# Patient Record
Sex: Male | Born: 1935 | Race: White | Hispanic: No | Smoking: Former smoker
Health system: Southern US, Community
[De-identification: ages and names within clinical notes are randomized; demographics above are authoritative.]

## PROBLEM LIST (undated history)

## (undated) DIAGNOSIS — C689 Malignant neoplasm of urinary organ, unspecified: Secondary | ICD-10-CM

## (undated) DIAGNOSIS — I10 Essential (primary) hypertension: Secondary | ICD-10-CM

## (undated) DIAGNOSIS — N289 Disorder of kidney and ureter, unspecified: Secondary | ICD-10-CM

## (undated) DIAGNOSIS — I4891 Unspecified atrial fibrillation: Secondary | ICD-10-CM

## (undated) DIAGNOSIS — B0229 Other postherpetic nervous system involvement: Secondary | ICD-10-CM

## (undated) HISTORY — DX: Other postherpetic nervous system involvement: B02.29

## (undated) HISTORY — DX: Disorder of kidney and ureter, unspecified: N28.9

## (undated) HISTORY — PX: APPENDECTOMY: SHX54

## (undated) HISTORY — DX: Unspecified atrial fibrillation: I48.91

## (undated) HISTORY — DX: Essential (primary) hypertension: I10

## (undated) HISTORY — DX: Malignant neoplasm of urinary organ, unspecified: C68.9

---

## 2000-04-11 ENCOUNTER — Encounter: Payer: Self-pay | Admitting: Internal Medicine

## 2000-10-31 DIAGNOSIS — B0229 Other postherpetic nervous system involvement: Secondary | ICD-10-CM

## 2003-12-17 ENCOUNTER — Encounter: Payer: Self-pay | Admitting: Internal Medicine

## 2004-06-12 ENCOUNTER — Ambulatory Visit: Payer: Self-pay | Admitting: Internal Medicine

## 2004-06-18 ENCOUNTER — Ambulatory Visit: Payer: Self-pay | Admitting: Internal Medicine

## 2004-11-30 DIAGNOSIS — D510 Vitamin B12 deficiency anemia due to intrinsic factor deficiency: Secondary | ICD-10-CM

## 2004-12-17 ENCOUNTER — Ambulatory Visit: Payer: Self-pay | Admitting: Internal Medicine

## 2004-12-21 ENCOUNTER — Ambulatory Visit: Payer: Self-pay | Admitting: Internal Medicine

## 2005-01-22 ENCOUNTER — Ambulatory Visit: Payer: Self-pay | Admitting: Internal Medicine

## 2005-02-22 ENCOUNTER — Ambulatory Visit: Payer: Self-pay | Admitting: Internal Medicine

## 2005-03-15 ENCOUNTER — Ambulatory Visit: Payer: Self-pay | Admitting: Unknown Physician Specialty

## 2005-03-22 ENCOUNTER — Ambulatory Visit: Payer: Self-pay | Admitting: Internal Medicine

## 2005-03-29 ENCOUNTER — Ambulatory Visit: Payer: Self-pay | Admitting: Internal Medicine

## 2005-04-29 ENCOUNTER — Ambulatory Visit: Payer: Self-pay | Admitting: Internal Medicine

## 2005-05-12 ENCOUNTER — Ambulatory Visit: Payer: Self-pay | Admitting: Internal Medicine

## 2005-05-27 ENCOUNTER — Ambulatory Visit: Payer: Self-pay | Admitting: Internal Medicine

## 2005-06-21 ENCOUNTER — Ambulatory Visit: Payer: Self-pay | Admitting: Internal Medicine

## 2006-04-27 ENCOUNTER — Ambulatory Visit: Payer: Self-pay | Admitting: Internal Medicine

## 2009-02-26 ENCOUNTER — Telehealth: Payer: Self-pay | Admitting: Internal Medicine

## 2009-03-31 ENCOUNTER — Ambulatory Visit: Payer: Self-pay | Admitting: Internal Medicine

## 2009-03-31 DIAGNOSIS — N259 Disorder resulting from impaired renal tubular function, unspecified: Secondary | ICD-10-CM | POA: Insufficient documentation

## 2009-03-31 DIAGNOSIS — I10 Essential (primary) hypertension: Secondary | ICD-10-CM | POA: Insufficient documentation

## 2009-04-02 LAB — CONVERTED CEMR LAB
AST: 24 units/L (ref 0–37)
Alkaline Phosphatase: 115 units/L (ref 39–117)
Basophils Absolute: 0 10*3/uL (ref 0.0–0.1)
Basophils Relative: 0 % (ref 0.0–3.0)
Glucose, Bld: 95 mg/dL (ref 70–99)
Hemoglobin: 15.7 g/dL (ref 13.0–17.0)
Lymphocytes Relative: 16.1 % (ref 12.0–46.0)
Monocytes Relative: 3.9 % (ref 3.0–12.0)
Neutro Abs: 5 10*3/uL (ref 1.4–7.7)
Phosphorus: 3.7 mg/dL (ref 2.3–4.6)
Potassium: 5.3 meq/L — ABNORMAL HIGH (ref 3.5–5.1)
RBC: 4.78 M/uL (ref 4.22–5.81)
Sodium: 141 meq/L (ref 135–145)
TSH: 3.61 microintl units/mL (ref 0.35–5.50)
WBC: 6.5 10*3/uL (ref 4.5–10.5)

## 2009-04-15 ENCOUNTER — Ambulatory Visit: Payer: Self-pay | Admitting: Internal Medicine

## 2009-04-15 DIAGNOSIS — R634 Abnormal weight loss: Secondary | ICD-10-CM

## 2009-04-15 LAB — CONVERTED CEMR LAB
Bilirubin Urine: NEGATIVE
Urobilinogen, UA: 0.2

## 2009-04-16 ENCOUNTER — Encounter: Payer: Self-pay | Admitting: Internal Medicine

## 2009-04-16 LAB — CONVERTED CEMR LAB
BUN: 25 mg/dL — ABNORMAL HIGH (ref 6–23)
CO2: 30 meq/L (ref 19–32)
Chloride: 102 meq/L (ref 96–112)
Potassium: 4.5 meq/L (ref 3.5–5.1)
Sodium: 139 meq/L (ref 135–145)

## 2009-04-21 ENCOUNTER — Ambulatory Visit: Payer: Self-pay | Admitting: Internal Medicine

## 2009-04-21 LAB — CONVERTED CEMR LAB: Potassium: 4.9 meq/L (ref 3.5–5.1)

## 2009-05-16 ENCOUNTER — Ambulatory Visit: Payer: Self-pay | Admitting: Internal Medicine

## 2010-06-01 ENCOUNTER — Ambulatory Visit: Payer: Self-pay | Admitting: Internal Medicine

## 2010-06-01 ENCOUNTER — Encounter: Payer: Self-pay | Admitting: Internal Medicine

## 2010-06-01 DIAGNOSIS — I4819 Other persistent atrial fibrillation: Secondary | ICD-10-CM

## 2010-06-01 DIAGNOSIS — R5381 Other malaise: Secondary | ICD-10-CM

## 2010-06-01 DIAGNOSIS — R5383 Other fatigue: Secondary | ICD-10-CM

## 2010-06-02 LAB — CONVERTED CEMR LAB
ALT: 18 units/L (ref 0–53)
AST: 24 units/L (ref 0–37)
Alkaline Phosphatase: 92 units/L (ref 39–117)
CO2: 26 meq/L (ref 19–32)
Chloride: 89 meq/L — ABNORMAL LOW (ref 96–112)
Eosinophils Relative: 0.3 % (ref 0.0–5.0)
GFR calc non Af Amer: 26.93 mL/min (ref >60)
HCT: 45 % (ref 39.0–52.0)
Hemoglobin: 15.6 g/dL (ref 13.0–17.0)
Lymphocytes Relative: 3.6 % — ABNORMAL LOW (ref 12.0–46.0)
Lymphs Abs: 0.5 10*3/uL — ABNORMAL LOW (ref 0.7–4.0)
Monocytes Relative: 4.6 % (ref 3.0–12.0)
Neutro Abs: 13 10*3/uL — ABNORMAL HIGH (ref 1.4–7.7)
Potassium: 3.1 meq/L — ABNORMAL LOW (ref 3.5–5.1)
RBC: 4.83 M/uL (ref 4.22–5.81)
Sodium: 132 meq/L — ABNORMAL LOW (ref 135–145)
TSH: 4.76 microintl units/mL (ref 0.35–5.50)
WBC: 14.2 10*3/uL — ABNORMAL HIGH (ref 4.5–10.5)

## 2010-06-05 ENCOUNTER — Ambulatory Visit: Payer: Self-pay | Admitting: Internal Medicine

## 2010-06-08 LAB — CONVERTED CEMR LAB
BUN: 31 mg/dL — ABNORMAL HIGH (ref 6–23)
Calcium: 8.6 mg/dL (ref 8.4–10.5)
Creatinine, Ser: 2.1 mg/dL — ABNORMAL HIGH (ref 0.4–1.5)
GFR calc non Af Amer: 31.53 mL/min (ref >60)
Glucose, Bld: 107 mg/dL — ABNORMAL HIGH (ref 70–99)
Sodium: 136 meq/L (ref 135–145)

## 2010-06-23 ENCOUNTER — Ambulatory Visit: Payer: Self-pay | Admitting: Internal Medicine

## 2010-06-24 LAB — CONVERTED CEMR LAB
BUN: 21 mg/dL (ref 6–23)
CO2: 31 meq/L (ref 19–32)
Calcium: 8.7 mg/dL (ref 8.4–10.5)
Creatinine, Ser: 1.9 mg/dL — ABNORMAL HIGH (ref 0.4–1.5)
Glucose, Bld: 74 mg/dL (ref 70–99)
Sodium: 139 meq/L (ref 135–145)

## 2010-09-01 NOTE — Assessment & Plan Note (Signed)
Summary: 4 DAY F/U DLO   Vital Signs:  Patient profile:   77 year old male Weight:      141 pounds O2 Sat:      95 % on Room air Temp:     97.9 degrees F oral Pulse rate:   90 / minute Pulse rhythm:   regular BP sitting:   120 / 60  (left arm) Cuff size:   regular  Vitals Entered By: Mervin Hack CMA Duncan Dull) (June 05, 2010 11:48 AM)  O2 Flow:  Room air CC: 4 day follow-up   History of Present Illness: Feels a little better Stopped the diuretic Has been taking the gatorade as instructed Appetite is still not very good  energy level is somewhat better has just started using boost  No chest pain No SOB No edema  Still with stable right posterior neck post herpetic neuralgia he uses topical Rx  Allergies: No Known Drug Allergies  Review of Systems       sleeping okay No nausea or vomiting  Physical Exam  General:  alert.  Brighter today Mouth:  thrush almost resolved open areas on lips now with dry eschar Neck:  supple, no masses, and no cervical lymphadenopathy.   Lungs:  normal respiratory effort, no intercostal retractions, no accessory muscle use, and normal breath sounds.   Heart:  normal rate, regular rhythm, no murmur, and no gallop.   Abdomen:  soft and non-tender.   Extremities:  no edema Psych:  normally interactive, good eye contact, not anxious appearing, and not depressed appearing.     Impression & Recommendations:  Problem # 1:  RENAL INSUFFICIENCY (ICD-588.9) Assessment Deteriorated  renal function was much worse likely a lot was prerenal he feels better now with diuretic stopped and some gatorade will recheck labs continue boost and gatorade  recheck in several weeks if no better, consider renal ultrasound and other work up if weight not up  Orders: TLB-Renal Function Panel (80069-RENAL) Venipuncture (16109)  Problem # 2:  ATRIAL FIBRILLATION (ICD-427.31) Assessment: Improved seems regular today now breathing  better  His updated medication list for this problem includes:    Aspirin 81 Mg Tabs (Aspirin) .Marland Kitchen... 1 tab daily to help prevent stroke  Complete Medication List: 1)  First-dukes Mouthwash Susp (Diphenhyd-hydrocort-nystatin) .... 5cc swish and mouth and then spit four times daily 2)  Aspirin 81 Mg Tabs (Aspirin) .Marland Kitchen.. 1 tab daily to help prevent stroke  Patient Instructions: 1)  Please continue to take gatorade and boost daily 2)  Please schedule a follow-up appointment in 2-3  weeks.    Orders Added: 1)  Est. Patient Level III [60454] 2)  TLB-Renal Function Panel [80069-RENAL] 3)  Venipuncture [09811]    Current Allergies (reviewed today): No known allergies

## 2010-09-01 NOTE — Assessment & Plan Note (Signed)
Summary: 2-3 WEEK FOLLOW UP/[facility]   Vital Signs:  Patient profile:   77 year old male Weight:      152 pounds Temp:     97.5 degrees F oral Pulse rate:   60 / minute Pulse rhythm:   regular BP sitting:   120 / 60  (left arm) Cuff size:   large  Vitals Entered By: Mervin Hack CMA Duncan Dull) (June 23, 2010 10:48 AM) CC: 2-3 week follow-up   History of Present Illness: Feels much better now still using the boost and gatorade  Strength is much better Still notes a little orthostatic dizziness but much better  No palpitations No chest pain No edema  Appetite is better eating more  Allergies: No Known Drug Allergies  Past History:  Past medical, surgical, family and social histories (including risk factors) reviewed for relevance to current acute and chronic problems.  Past Medical History: Reviewed history from 06/01/2010 and no changes required. Hypertension Renal insufficiency Post herpetic neuralgia Atrial fibrillation  Past Surgical History: Reviewed history from 03/31/2009 and no changes required. Appendectomy-1937 Kidney Stones-1970  Family History: Reviewed history from 03/31/2009 and no changes required. Dad died--unknown causes Mom died in 71's of old age 30 brother, 1 sister No CAD,DM,HTN No prostate or colon cancer  Social History: Reviewed history from 03/31/2009 and no changes required. 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  LOOKs much better Neck:  supple, no masses, no thyromegaly, and no cervical lymphadenopathy.   Lungs:  normal respiratory effort, no intercostal retractions, no accessory muscle use, and normal breath  sounds.   Heart:  normal rate, regular rhythm, no murmur, and no gallop.   Extremities:  no edema Psych:  normally interactive, good eye contact, not anxious appearing, and not depressed appearing.     Impression & Recommendations:  Problem # 1:  WEIGHT LOSS, ABNORMAL (ICD-783.21) Assessment Improved markedly better may be fluid and real weight gain will have him continue the boost  Problem # 2:  RENAL INSUFFICIENCY (ICD-588.9) Assessment: Comment Only  will recheck  now seems euvolemic will have him stop the gatorade  Orders: TLB-Renal Function Panel (80069-RENAL) Venipuncture (16109)  Problem # 3:  ATRIAL FIBRILLATION (ICD-427.31) Assessment: Comment Only paroxysmal seemed to only come on with his severe volume restriction ASA only  His updated medication list for this problem includes:    Aspirin 81 Mg Tabs (Aspirin) .Marland Kitchen... 1 tab daily to help prevent stroke  Problem # 4:  HYPERTENSION (ICD-401.9) Assessment: Comment Only no longer needs meds  BP today: 120/60 Prior BP: 120/60 (06/05/2010)  Labs Reviewed: K+: 4.5 (06/05/2010) Creat: : 2.1 (06/05/2010)     Complete Medication List: 1)  First-dukes Mouthwash Susp (Diphenhyd-hydrocort-nystatin) .... 5cc swish and mouth and then spit four times daily 2)  Aspirin 81 Mg Tabs (Aspirin) .Marland Kitchen.. 1 tab daily to help prevent stroke  Patient Instructions: 1)  Please continue the boost at least once daily 2)  Please stop the gatorade 3)  Please schedule a follow-up appointment in 3 months .    Orders Added: 1)  TLB-Renal Function Panel [80069-RENAL] 2)  Venipuncture [60454] 3)  Est. Patient Level IV [id number]    Current Allergies (reviewed today): No known allergies

## 2010-09-01 NOTE — Assessment & Plan Note (Signed)
Summary: NOT FEELING WELL   Vital Signs:  Patient profile:   77 year old male Height:      70 inches Weight:      144 pounds BMI:     20.74 O2 Sat:      99 % on Room air Temp:     97.8 degrees F oral Pulse rate:   86 / minute Pulse rhythm:   regular BP sitting:   120 / 60  (left arm) Cuff size:   regular  Vitals Entered By: Mervin Hack CMA Koltyn Kelsay Dull) (June 01, 2010 11:44 AM)  O2 Flow:  Room air CC: not feeling well, dizzy   History of Present Illness: Not feeling well Having trouble walking --  "like I'm drunk" Lips are cracked  Going on for at least 3 months Not eating too well---appetite is off No nausea or vomiting No diarrhea No blood in stool  No cough No SOB---other than usual DOE which has worsened somewhat  some feelings of depression-- "because I can't do what I used to do" can't garden, etc Hasn't had any enjoyment in life over the past few months   Allergies: No Known Drug Allergies  Past History:  Past medical, surgical, family and social histories (including risk factors) reviewed for relevance to current acute and chronic problems.  Past Medical History: Hypertension Renal insufficiency Post herpetic neuralgia Atrial fibrillation  Past Surgical History: Reviewed history from 03/31/2009 and no changes required. Appendectomy-1937 Kidney Stones-1970  Family History: Reviewed history from 03/31/2009 and no changes required. Dad died--unknown causes Mom died in 28's of old age 32 brother, 1 sister No CAD,DM,HTN No prostate or colon cancer  Social History: Reviewed history from 03/31/2009 and no changes required. 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  Cachectic and appears chronically  ill Mouth:  Thrush noted and dry lips Neck:  supple, no masses, no thyromegaly, no carotid bruits, and no cervical lymphadenopathy.   Lungs:  normal respiratory effort, no intercostal retractions, no accessory muscle use, normal breath sounds, and no dullness.   Heart:  normal rate, no murmur, no gallop, and irregular rhythm.   Abdomen:  soft, non-tender, no hepatomegaly, and no splenomegaly.   Msk:  no joint tenderness and no joint swelling.   Pulses:  faint in feet Extremities:  no edema Neurologic:  bradykinesia but normal tone no sig tremor Skin:  no suspicious lesions and no ulcerations.   Psych:  normally interactive, not anxious appearing, and subdued.     Impression & Recommendations:  Problem # 1:  FATIGUE (ICD-780.79) Assessment New  has lost weight thrush makes me wonder about DM BP is low so will stop the HCTZ will check labs and proceed with further eval as indicated  Orders: TLB-Renal Function Panel (80069-RENAL) TLB-CBC Platelet - w/Differential (85025-CBCD) TLB-Hepatic/Liver Function Pnl (80076-HEPATIC) TLB-TSH (Thyroid Stimulating Hormone) (84443-TSH) Venipuncture (84696)  Problem # 2:  ATRIAL FIBRILLATION (ICD-427.31) Assessment: New  may explain easier DOE but not the overall status will just start ASA for now status too unstable to put on coumadin  His updated medication list for this problem includes:    Aspirin 81 Mg Tabs (Aspirin) .Marland Kitchen... 1 tab daily to help prevent stroke  Orders: EKG w/ Interpretation (  93000)  Problem # 3:  WEIGHT LOSS, ABNORMAL (ICD-783.21) Assessment: Deteriorated has worsened may want to try mirtazapine  Complete Medication List: 1)  First-dukes Mouthwash Susp (Diphenhyd-hydrocort-nystatin) .... 5cc swish and mouth and then spit four times daily 2)  Aspirin 81 Mg Tabs (Aspirin) .Marland Kitchen.. 1 tab daily to help prevent stroke  Patient Instructions: 1)  Please stop HCTZ 2)  Please start 1 low dose aspirin daily 3)  Please try  ensure mixed with ice cream once or twice daily 4)  Try the mouth wash that the pharmacy will have for you 5)  Please set up a follow up appt in 3-4 days Prescriptions: FIRST-DUKES MOUTHWASH  SUSP (DIPHENHYD-HYDROCORT-NYSTATIN) 5cc swish and mouth and then spit four times daily  #32 oz x 1   Entered and Authorized by:   Cindee Salt MD   Signed by:   Cindee Salt MD on 06/01/2010   Method used:   Electronically to        [facility] [phone]* (retail)       [address] [place], [place]  [address]       Ph: [phone]       Fax: [phone]   RxID:   [id number]    Orders Added: 1)  TLB-Renal Function Panel [80069-RENAL] 2)  TLB-CBC Platelet - w/Differential [85025-CBCD] 3)  TLB-Hepatic/Liver Function Pnl [80076-HEPATIC] 4)  TLB-TSH (Thyroid Stimulating Hormone) [84443-TSH] 5)  Venipuncture [44034] 6)  EKG w/ Interpretation [93000] 7)  Est. Patient Level IV [74259]    Current Allergies (reviewed today): No known allergies

## 2010-09-22 ENCOUNTER — Other Ambulatory Visit: Payer: Self-pay | Admitting: Internal Medicine

## 2010-09-22 ENCOUNTER — Encounter: Payer: Self-pay | Admitting: Internal Medicine

## 2010-09-22 ENCOUNTER — Ambulatory Visit (INDEPENDENT_AMBULATORY_CARE_PROVIDER_SITE_OTHER): Payer: Medicare Other | Admitting: Internal Medicine

## 2010-09-22 DIAGNOSIS — G589 Mononeuropathy, unspecified: Secondary | ICD-10-CM

## 2010-09-22 DIAGNOSIS — N259 Disorder resulting from impaired renal tubular function, unspecified: Secondary | ICD-10-CM

## 2010-09-22 DIAGNOSIS — D510 Vitamin B12 deficiency anemia due to intrinsic factor deficiency: Secondary | ICD-10-CM

## 2010-09-22 DIAGNOSIS — R634 Abnormal weight loss: Secondary | ICD-10-CM

## 2010-09-22 DIAGNOSIS — I4891 Unspecified atrial fibrillation: Secondary | ICD-10-CM

## 2010-09-23 LAB — RENAL FUNCTION PANEL
BUN: 24 mg/dL — ABNORMAL HIGH (ref 6–23)
Chloride: 108 mEq/L (ref 96–112)
GFR: 38.8 mL/min — ABNORMAL LOW (ref >60.00)
Glucose, Bld: 73 mg/dL (ref 70–99)
Phosphorus: 3 mg/dL (ref 2.3–4.6)
Potassium: 4.8 mEq/L (ref 3.5–5.1)

## 2010-09-29 NOTE — Assessment & Plan Note (Signed)
Summary: 3 MONTH FOLLOW UP   Vital Signs:  Patient profile:   77 year old male Weight:      159 pounds Temp:     98.2 degrees F oral Pulse rate:   60 / minute Pulse rhythm:   regular BP sitting:   160 / 60  (left arm) Cuff size:   large  Vitals Entered By: Mervin Hack CMA Duncan Dull) (September 22, 2010 10:33 AM) CC: follow-up   History of Present Illness: " I feel wonderful" Still has post herpetic neuralgia---is off the meds Able to deal with it  Stopped gatorade No longer on boost Eating well--just regular diet  No heart trouble No palpitations no edema No chest pain No SOB  Does occ have some orthostatic dizziness Walks "kinda drunk" at first--- does get some stability over time but it does persist legs a little numb from knees down no falls  Allergies: No Known Drug Allergies  Past History:  Past medical, surgical, family and social histories (including risk factors) reviewed for relevance to current acute and chronic problems.  Past Medical History: Reviewed history from 06/01/2010 and no changes required. Hypertension Renal insufficiency Post herpetic neuralgia Atrial fibrillation  Past Surgical History: Reviewed history from 03/31/2009 and no changes required. Appendectomy-1937 Kidney Stones-1970  Family History: Reviewed history from 03/31/2009 and no changes required. Dad died--unknown causes Mom died in 33's of old age 45 brother, 1 sister No CAD,DM,HTN No prostate or colon cancer  Social History: Reviewed history from 03/31/2009 and no changes required. 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   Neck:  supple, no masses, no thyromegaly, no carotid  bruits, and no cervical lymphadenopathy.   Lungs:  normal respiratory effort, no intercostal retractions, no accessory muscle use, and normal breath sounds.   Heart:  normal rate, regular rhythm, no murmur, and no gallop.   Extremities:  no edema Neurologic:  strength normal in all extremities.   Gait has normal full weight bearing but some hesitaiton Not ataxic Psych:  normally interactive, good eye contact, not anxious appearing, and not depressed appearing.     Impression & Recommendations:  Problem # 1:  NEUROPATHY (ICD-355.9) Assessment New discussed using cane---he refuses will check B12 level as he has been off Rx try oral briefly if low  Problem # 2:  WEIGHT LOSS, ABNORMAL (ICD-783.21) Assessment: Improved back to his usual baseline again  Problem # 3:  ATRIAL FIBRILLATION (ICD-427.31) Assessment: Improved paroxysmal no apparent recurrence since no longer hypovolemic  His updated medication list for this problem includes:    Aspirin 81 Mg Tabs (Aspirin) .Marland Kitchen... 1 tab daily to help prevent stroke  Problem # 4:  RENAL INSUFFICIENCY (ICD-588.9) Assessment: Unchanged  will recheck  Orders: TLB-Renal Function Panel (80069-RENAL) Venipuncture (04540)  Problem # 5:  POSTHERPETIC NEURALGIA (ICD-053.19) Assessment: Unchanged chronic pain but not severe no meds  Complete Medication List: 1)  Aspirin 81 Mg Tabs (Aspirin) .Marland Kitchen.. 1 tab daily to help prevent stroke  Other Orders: TLB-B12, Serum-Total ONLY (98119-J47)  Patient Instructions: 1)  Please schedule a follow-up appointment in 4 months .    Orders Added: 1)  TLB-Renal Function Panel [80069-RENAL] 2)  Venipuncture [82956] 3)  TLB-B12, Serum-Total ONLY [82607-B12] 4)  Est. Patient Level IV [01027]    Current Allergies (reviewed today): No known allergies

## 2010-10-07 ENCOUNTER — Encounter (INDEPENDENT_AMBULATORY_CARE_PROVIDER_SITE_OTHER): Payer: Self-pay | Admitting: *Deleted

## 2010-10-07 ENCOUNTER — Other Ambulatory Visit (INDEPENDENT_AMBULATORY_CARE_PROVIDER_SITE_OTHER): Payer: Medicare Other

## 2010-10-07 ENCOUNTER — Other Ambulatory Visit: Payer: Self-pay | Admitting: Internal Medicine

## 2010-10-07 DIAGNOSIS — D510 Vitamin B12 deficiency anemia due to intrinsic factor deficiency: Secondary | ICD-10-CM

## 2010-10-07 DIAGNOSIS — R5383 Other fatigue: Secondary | ICD-10-CM

## 2010-10-07 DIAGNOSIS — R5381 Other malaise: Secondary | ICD-10-CM

## 2010-10-07 LAB — VITAMIN B12: Vitamin B-12: 323 pg/mL (ref 211–911)

## 2011-01-19 ENCOUNTER — Encounter: Payer: Self-pay | Admitting: Internal Medicine

## 2011-01-20 ENCOUNTER — Ambulatory Visit: Payer: Medicare Other | Admitting: Internal Medicine

## 2011-01-20 DIAGNOSIS — Z0289 Encounter for other administrative examinations: Secondary | ICD-10-CM

## 2011-02-15 ENCOUNTER — Telehealth: Payer: Self-pay | Admitting: *Deleted

## 2011-02-15 DIAGNOSIS — R21 Rash and other nonspecific skin eruption: Secondary | ICD-10-CM

## 2011-02-15 NOTE — Telephone Encounter (Signed)
Referral made 

## 2011-02-15 NOTE — Telephone Encounter (Signed)
Patient has a itchy rash on his chest and back. He says that it is little raised bumps, and that he has seen you for this before a couple of years ago and that you had mentioned him seeing a dermatologist. He is asking if you could refer him to a dermatologist. Please advise.

## 2011-02-16 ENCOUNTER — Telehealth: Payer: Self-pay | Admitting: Internal Medicine

## 2011-02-16 NOTE — Telephone Encounter (Signed)
Derm appt made with Dr Jesusita Oka on 02/18/2011 at 3pm. [facility]

## 2011-02-16 NOTE — Telephone Encounter (Signed)
Derm appt made with Dr Cheree Ditto on 02/18/2011 at 3pm.

## 2011-05-28 ENCOUNTER — Encounter: Payer: Self-pay | Admitting: Internal Medicine

## 2011-05-28 ENCOUNTER — Ambulatory Visit (INDEPENDENT_AMBULATORY_CARE_PROVIDER_SITE_OTHER): Payer: Medicare Other | Admitting: Internal Medicine

## 2011-05-28 ENCOUNTER — Inpatient Hospital Stay (HOSPITAL_COMMUNITY)
Admission: EM | Admit: 2011-05-28 | Discharge: 2011-06-03 | DRG: 687 | Disposition: A | Discharge status: Home / Self Care | Payer: Medicare Other | Attending: Family Medicine | Admitting: Family Medicine

## 2011-05-28 ENCOUNTER — Emergency Department (HOSPITAL_COMMUNITY): Payer: Medicare Other

## 2011-05-28 DIAGNOSIS — E039 Hypothyroidism, unspecified: Secondary | ICD-10-CM | POA: Diagnosis present

## 2011-05-28 DIAGNOSIS — E872 Acidosis, unspecified: Secondary | ICD-10-CM | POA: Diagnosis present

## 2011-05-28 DIAGNOSIS — Z87891 Personal history of nicotine dependence: Secondary | ICD-10-CM

## 2011-05-28 DIAGNOSIS — C677 Malignant neoplasm of urachus: Principal | ICD-10-CM | POA: Diagnosis present

## 2011-05-28 DIAGNOSIS — N183 Chronic kidney disease, stage 3 unspecified: Secondary | ICD-10-CM | POA: Diagnosis present

## 2011-05-28 DIAGNOSIS — K8020 Calculus of gallbladder without cholecystitis without obstruction: Secondary | ICD-10-CM | POA: Diagnosis present

## 2011-05-28 DIAGNOSIS — I4891 Unspecified atrial fibrillation: Secondary | ICD-10-CM | POA: Diagnosis present

## 2011-05-28 DIAGNOSIS — N390 Urinary tract infection, site not specified: Secondary | ICD-10-CM | POA: Diagnosis present

## 2011-05-28 DIAGNOSIS — Z66 Do not resuscitate: Secondary | ICD-10-CM | POA: Diagnosis present

## 2011-05-28 DIAGNOSIS — I129 Hypertensive chronic kidney disease with stage 1 through stage 4 chronic kidney disease, or unspecified chronic kidney disease: Secondary | ICD-10-CM | POA: Diagnosis present

## 2011-05-28 DIAGNOSIS — R64 Cachexia: Secondary | ICD-10-CM | POA: Diagnosis present

## 2011-05-28 DIAGNOSIS — I4892 Unspecified atrial flutter: Secondary | ICD-10-CM | POA: Insufficient documentation

## 2011-05-28 DIAGNOSIS — Z7982 Long term (current) use of aspirin: Secondary | ICD-10-CM

## 2011-05-28 DIAGNOSIS — N179 Acute kidney failure, unspecified: Secondary | ICD-10-CM | POA: Diagnosis present

## 2011-05-28 DIAGNOSIS — B0229 Other postherpetic nervous system involvement: Secondary | ICD-10-CM | POA: Diagnosis present

## 2011-05-28 DIAGNOSIS — R630 Anorexia: Secondary | ICD-10-CM | POA: Diagnosis present

## 2011-05-28 LAB — COMPREHENSIVE METABOLIC PANEL
AST: 16 U/L (ref 0–37)
Albumin: 2.1 g/dL — ABNORMAL LOW (ref 3.5–5.2)
CO2: 16 mEq/L — ABNORMAL LOW (ref 19–32)
Calcium: 9.1 mg/dL (ref 8.4–10.5)
Creatinine, Ser: 2.41 mg/dL — ABNORMAL HIGH (ref 0.50–1.35)
GFR calc non Af Amer: 22 mL/min — ABNORMAL LOW (ref >90)

## 2011-05-28 LAB — URINALYSIS, ROUTINE W REFLEX MICROSCOPIC
Glucose, UA: NEGATIVE mg/dL
Protein, ur: 100 mg/dL — AB
Urobilinogen, UA: 1 mg/dL (ref 0.0–1.0)

## 2011-05-28 LAB — POCT I-STAT TROPONIN I: Troponin i, poc: 0.02 ng/mL (ref 0.00–0.08)

## 2011-05-28 LAB — CBC
Hemoglobin: 13 g/dL (ref 13.0–17.0)
RBC: 4.51 MIL/uL (ref 4.22–5.81)
WBC: 11.8 10*3/uL — ABNORMAL HIGH (ref 4.0–10.5)

## 2011-05-28 LAB — DIFFERENTIAL
Basophils Absolute: 0 10*3/uL (ref 0.0–0.1)
Lymphocytes Relative: 5 % — ABNORMAL LOW (ref 12–46)
Monocytes Relative: 3 % (ref 3–12)

## 2011-05-28 LAB — OCCULT BLOOD, POC DEVICE: Fecal Occult Bld: POSITIVE

## 2011-05-28 LAB — PROTIME-INR
INR: 1.21 (ref 0.00–1.49)
Prothrombin Time: 15.6 seconds — ABNORMAL HIGH (ref 11.6–15.2)

## 2011-05-28 LAB — URINE MICROSCOPIC-ADD ON

## 2011-05-28 NOTE — Progress Notes (Signed)
  Subjective:    Patient ID: [name], male    DOB: [birth year], 77 y.o.   MRN: [id number]  HPI "I just don't feel well" Goes back about a week Not eating much per daughter  No chest pain Has DOE just walking in house No edema No palpitations  Occ stomach pain No sig joint pains  Current Outpatient Prescriptions on File Prior to Visit  Medication Sig Dispense Refill  . aspirin 81 MG tablet Take 81 mg by mouth daily.          No Known Allergies  Past Medical History  Diagnosis Date  . Hypertension   . Renal insufficiency   . Post herpetic neuralgia   . Atrial fibrillation     Past Surgical History  Procedure Date  . Appendectomy     Family History  Problem Relation Age of Onset  . Coronary artery disease Neg Hx   . Diabetes Neg Hx   . Hypertension Neg Hx   . Cancer Neg Hx     History   Social History  . Marital Status: Widowed    Spouse Name: N/A    Number of Children: 1  . Years of Education: N/A   Occupational History  . retired Actor    Social History Main Topics  . Smoking status: Former Smoker    Quit date: 08/03/1943  . Smokeless tobacco: Never Used  . Alcohol Use: Yes     occasional  . Drug Use: Not on file  . Sexually Active: Not on file   Other Topics Concern  . Not on file   Social History Narrative  . No narrative on file      Review of Systems No fever No nausea or vomiting Some constipation Urine stream is slow and trouble initiating    Objective:   Physical Exam  Constitutional:       Somewhat gaunt---looking down but engages and appropriate with me Appears ill  Neck: Normal range of motion.  Cardiovascular: Normal heart sounds.  Exam reveals no gallop.   No murmur heard.      Rapid ~180  Seems regular  Pulmonary/Chest: Effort normal and breath sounds normal. No respiratory distress. He has no wheezes. He has no rales.  Abdominal: Soft. There is tenderness.       Generalized mild tenderness  Musculoskeletal: He  exhibits no edema.  Lymphadenopathy:    He has no cervical adenopathy.  Psychiatric:       afffect is constricted Doesn't really seem depressed, just ill          Assessment & Plan:

## 2011-05-28 NOTE — Assessment & Plan Note (Addendum)
EKG shows atrial flutter with 2:1 block This is new No CHF but likely has coronary ischemia EMS is transporting to [facility] Discussed with Trish of Cardiology

## 2011-05-29 ENCOUNTER — Inpatient Hospital Stay (HOSPITAL_COMMUNITY): Payer: Medicare Other

## 2011-05-29 LAB — CARDIAC PANEL(CRET KIN+CKTOT+MB+TROPI)
CK, MB: 3.5 ng/mL (ref 0.3–4.0)
Relative Index: INVALID (ref 0.0–2.5)
Total CK: 44 U/L (ref 7–232)
Total CK: 56 U/L (ref 7–232)
Troponin I: <0.3 ng/mL (ref <0.30)

## 2011-05-29 LAB — DIFFERENTIAL
Basophils Absolute: 0 10*3/uL (ref 0.0–0.1)
Lymphocytes Relative: 6 % — ABNORMAL LOW (ref 12–46)
Neutro Abs: 10.8 10*3/uL — ABNORMAL HIGH (ref 1.7–7.7)
Neutrophils Relative %: 91 % — ABNORMAL HIGH (ref 43–77)

## 2011-05-29 LAB — CBC
HCT: 33.5 % — ABNORMAL LOW (ref 39.0–52.0)
Hemoglobin: 11 g/dL — ABNORMAL LOW (ref 13.0–17.0)
RBC: 3.94 MIL/uL — ABNORMAL LOW (ref 4.22–5.81)
WBC: 11.8 10*3/uL — ABNORMAL HIGH (ref 4.0–10.5)

## 2011-05-29 LAB — SODIUM, URINE, RANDOM: Sodium, Ur: 23 mEq/L

## 2011-05-29 LAB — URINE CULTURE
Colony Count: >=100000
Culture  Setup Time: 201210261831

## 2011-05-29 LAB — CREATININE, URINE, RANDOM: Creatinine, Urine: 159.98 mg/dL

## 2011-05-29 LAB — HEMOGLOBIN A1C: Hgb A1c MFr Bld: 6.2 % — ABNORMAL HIGH (ref <5.7)

## 2011-05-29 LAB — COMPREHENSIVE METABOLIC PANEL
AST: 19 U/L (ref 0–37)
BUN: 55 mg/dL — ABNORMAL HIGH (ref 6–23)
CO2: 16 mEq/L — ABNORMAL LOW (ref 19–32)
Chloride: 107 mEq/L (ref 96–112)
Creatinine, Ser: 2.25 mg/dL — ABNORMAL HIGH (ref 0.50–1.35)
GFR calc Af Amer: 28 mL/min — ABNORMAL LOW (ref >90)
GFR calc non Af Amer: 24 mL/min — ABNORMAL LOW (ref >90)
Glucose, Bld: 96 mg/dL (ref 70–99)
Total Bilirubin: 0.4 mg/dL (ref 0.3–1.2)

## 2011-05-30 ENCOUNTER — Inpatient Hospital Stay (HOSPITAL_COMMUNITY): Payer: Medicare Other

## 2011-05-30 ENCOUNTER — Other Ambulatory Visit (HOSPITAL_COMMUNITY): Payer: Medicare Other

## 2011-05-30 DIAGNOSIS — I517 Cardiomegaly: Secondary | ICD-10-CM

## 2011-05-30 LAB — BASIC METABOLIC PANEL
BUN: 42 mg/dL — ABNORMAL HIGH (ref 6–23)
Calcium: 8.5 mg/dL (ref 8.4–10.5)
Creatinine, Ser: 1.84 mg/dL — ABNORMAL HIGH (ref 0.50–1.35)
GFR calc Af Amer: 36 mL/min — ABNORMAL LOW (ref >90)
GFR calc non Af Amer: 31 mL/min — ABNORMAL LOW (ref >90)
Glucose, Bld: 83 mg/dL (ref 70–99)

## 2011-05-30 LAB — CBC
HCT: 31.9 % — ABNORMAL LOW (ref 39.0–52.0)
MCHC: 32.9 g/dL (ref 30.0–36.0)
MCV: 85.8 fL (ref 78.0–100.0)
RDW: 15.8 % — ABNORMAL HIGH (ref 11.5–15.5)

## 2011-05-30 NOTE — Consult Note (Signed)
[name], [name] NO.:  [id number]  MEDICAL RECORD NO.:  [id number]  LOCATION:  3738                         FACILITY:  [facility]  PHYSICIAN:  Veverly Fells. Excell Seltzer, MD  DATE OF BIRTH:  19-Oct-1920  DATE OF CONSULTATION:  05/28/2011 DATE OF DISCHARGE:                                CONSULTATION   REASON FOR CONSULTATION:  Atrial fibrillation.  HISTORY OF PRESENT ILLNESS:  [name] is an 77 year old gentleman with reported history of paroxysmal atrial fibrillation.  He presented to see Dr. Alphonsus Sias today because he just did not feel well.  His symptoms date back at least 1 week and the patient describes abdominal pain, poor p.o. intake, and anorexia and I reviewed the patient's weights in his outpatient charts and when he was seen in February 2012, he weighed 159 pounds and today, he weighs 129 pounds.  At the time of his evaluation today, he was noted to be in atrial fib or flutter with rapid ventricular rate.  His heart rate was reportedly 165 beats per minute and his blood pressure was 86/58.  He was transported to the emergency room by EMS for further evaluation. The patient has predominantly been in sinus rhythm by review of Dr. Karle Starch notes.  He has had some paroxysms of atrial fibrillation which have been related to dehydration.  He has not been on long-term anticoagulation.  He has no history of an echocardiogram, cardiac cath, or other cardiac evaluation.  HOME MEDICATIONS:  Only aspirin 81 mg daily.  ALLERGIES:  No known drug allergies.  PAST MEDICAL HISTORY:  Pertinent for: 1. Essential hypertension. 2. Chronic kidney disease, stage 3, with most recent creatinine     clearance of 39 mL/minute.  Serum creatinine was 1.8 mg/dL back in     February.  Today's labs are pending. 3. Paroxysmal atrial fibrillation but predominantly in sinus rhythm. 4. Remote appendectomy. 5. Post herpetic neuralgia.  FAMILY HISTORY:  Negative for premature coronary  artery disease, diabetes, or hypertension.  SOCIAL HISTORY:  The patient is a widower.  He has a daughter who lives nearby.  He is a retired Scientist, water quality.  He smoked cigarettes remotely but quit in 1945.  He rarely drinks alcohol.  REVIEW OF SYSTEMS:  Positives include difficulty urinating with hesitancy and frequency, constipation, and weight loss.  Otherwise, negative except as per HPI.  He specifically denies chest pain, palpitations, or dyspnea.  PHYSICAL EXAMINATION:  GENERAL:  The patient is a frail, cachectic elderly male, in no acute distress. VITAL SIGNS:  His blood pressure is 105/50, heart rate is 109, respiratory rate is 18. HEENT:  Normal. NECK:  Normal carotid upstrokes without bruits.  Jugular venous pressure is normal.  No thyromegaly or thyroid nodules. LUNGS:  There is diffusely diminished breath sounds bilaterally. CARDIOVASCULAR:  The apex is not palpable.  The heart is distant and irregularly irregular without murmur or gallop.  ABDOMEN:  Soft but there is exquisite tenderness over the suprapubic region and bilateral lower quadrants.  There is no CVA tenderness and no epigastric tenderness present.  Bowel sounds are quiet.  There is no organomegaly appreciated. EXTREMITIES:  There is no clubbing,  cyanosis, or edema.  Pedal pulses are nonpalpable.  Radial pulses are 2+.  The left foot has mild acrocyanosis, but it is nontender.  The patient moves all extremities without difficulty and there are no focal neurologic deficits.  Cranial nerves II through XII are intact.  EKG shows atrial fibrillation with heart rate of 116 beats per minute. There is a possible anteroseptal infarct, age undetermined. Chest x-ray shows biapical scarring with no acute findings, recommendation for PA and lateral chest x-ray was made.  White blood cell count is 11.8, hemoglobin 13.0, platelet count 197,000. INR 1.2.  Fecal occult blood is positive.  Troponin 0.02.  Lactic acid is 3.4,  creatinine is 2.4, BUN is 57.  ASSESSMENT: 1. Acute abdominal pain. 2. Atrial fibrillation with rapid ventricular response, now rate     controlled on intravenous Cardizem. 3. A 30-pound weight loss with cachexia and anorexia. 4. Acute-on-chronic kidney disease, stage 3, with creatinine clearance     22. 5. Hypertension.  RECOMMENDATIONS:  I suspect that the patient has an acute intra- abdominal process.  Possibilities include an embolic event related to atrial fib with ischemic gut versus malignancy in the setting of weight loss and anorexia.  The emergency department has ordered a CT scan with oral contrast to evaluate his abdomen.  With respect to his atrial fibrillation, the patient is stable on IV Cardizem.  We will check a 2-D echocardiogram and await anticoagulation until his abdominal process is better defined.  He should receive intravenous fluids for his acute-on-chronic kidney disease.  We will follow along in consultation as his cardiac issue does not seem to be the primary problem at the present time.     Veverly Fells. Excell Seltzer, MD     MDC/MEDQ  D:  05/28/2011  T:  05/29/2011  Job:  [id number]  cc:   Karie Schwalbe, MD  Electronically Signed by Tonny Bollman MD on 05/30/2011 10:10:20 PM

## 2011-05-31 ENCOUNTER — Other Ambulatory Visit: Payer: Self-pay

## 2011-05-31 LAB — BASIC METABOLIC PANEL
BUN: 35 mg/dL — ABNORMAL HIGH (ref 6–23)
CO2: 17 mEq/L — ABNORMAL LOW (ref 19–32)
Calcium: 8.4 mg/dL (ref 8.4–10.5)
Creatinine, Ser: 1.71 mg/dL — ABNORMAL HIGH (ref 0.50–1.35)
GFR calc Af Amer: 39 mL/min — ABNORMAL LOW (ref >90)

## 2011-05-31 LAB — CLOSTRIDIUM DIFFICILE BY PCR: Toxigenic C. Difficile by PCR: NEGATIVE

## 2011-05-31 LAB — CBC
HCT: 31.3 % — ABNORMAL LOW (ref 39.0–52.0)
MCH: 27.2 pg (ref 26.0–34.0)
MCV: 86.9 fL (ref 78.0–100.0)
Platelets: 198 10*3/uL (ref 150–400)
RDW: 16 % — ABNORMAL HIGH (ref 11.5–15.5)

## 2011-05-31 NOTE — Progress Notes (Signed)
@   Subjective:  Denies SSCP, palpitations or Dyspnea Feels weak with poor appetite  Objective:  Vital Signs in the last 24 hours:       Wt Readings from Last 1 Encounters:  05/28/11 129 lb (58.514 kg)       Intake/Output from previous day:   Intake/Output from this shift:    Physical Exam:  Affect appropriate Chronically ill HEENT: normal Neck supple with no adenopathy JVP normal no bruits no thyromegaly Lungs clear with no wheezing and good diaphragmatic motion Heart:  S1/S2 no murmur,rub, gallop or click PMI normal Abdomen: benighn, BS positve, no tenderness, no AAA no bruit.  No HSM or HJR Distal pulses intact with no bruits No edema Neuro non-focal Skin warm and dry No muscular weakness   Lab Results:  Basename 05/31/11 0600 05/30/11 0618  WBC 13.9* 13.2*  HGB 9.8* 10.5*  PLT 198 167    Basename 05/31/11 0600 05/30/11 0618  NA 138 138  K 4.0 4.1  CL 113* 111  CO2 17* 18*  GLUCOSE 96 83  BUN 35* 42*  CREATININE 1.71* 1.84*    Basename 05/29/11 1203 05/29/11 0600  TROPONINI <0.30 <0.30   Hepatic Function Panel  Basename 05/29/11 0600  PROT 5.8*  ALBUMIN 1.7*  AST 19  ALT 10  ALKPHOS 78  BILITOT 0.4  BILIDIR --  IBILI --   No results found for this basename: CHOL in the last 72 hours No results found for this basename: PROTIME in the last 72 hours  Imaging: Imaging results have been reviewed  Cardiac Studies:  ECG:  Orders placed in visit on 06/01/10  . CONVERTED CEMR EKG     Telemetry:  Echo:   Medications:  No current facility-administered medications for this encounter.   Assessment/Plan:   Patient Active Problem List  Diagnoses  . POSTHERPETIC NEURALGIA  . ANEMIA, PERNICIOUS: post chemo follow.  Marland Kitchen HYPERTENSION: Well controlled.  Continue current medications and low sodium Dash type diet.     . ATRIAL FIBRILLATION:  Rate control is good continue iv lopresser due to poor intake   . RENAL INSUFFICIENCY:  Prerenal  azotemia and failure to thrive.  Hydrate  . FATIGUE  . WEIGHT LOSS, ABNORMAL  . NEUROPATHY            Charlton Haws 05/31/2011, 2:06 PM

## 2011-06-01 ENCOUNTER — Encounter: Payer: Self-pay | Admitting: Internal Medicine

## 2011-06-01 LAB — BASIC METABOLIC PANEL
BUN: 28 mg/dL — ABNORMAL HIGH (ref 6–23)
Calcium: 8.4 mg/dL (ref 8.4–10.5)
GFR calc Af Amer: 38 mL/min — ABNORMAL LOW (ref >90)
GFR calc non Af Amer: 32 mL/min — ABNORMAL LOW (ref >90)
Glucose, Bld: 108 mg/dL — ABNORMAL HIGH (ref 70–99)
Potassium: 4.2 mEq/L (ref 3.5–5.1)

## 2011-06-01 LAB — CBC
HCT: 31.5 % — ABNORMAL LOW (ref 39.0–52.0)
Hemoglobin: 9.9 g/dL — ABNORMAL LOW (ref 13.0–17.0)
MCH: 27.5 pg (ref 26.0–34.0)
MCHC: 31.4 g/dL (ref 30.0–36.0)
RDW: 16.1 % — ABNORMAL HIGH (ref 11.5–15.5)

## 2011-06-01 NOTE — H&P (Signed)
[name], [name] NO.:  [id number]  MEDICAL RECORD NO.:  [id number]  LOCATION:  [facility]                         FACILITY:  [facility]  PHYSICIAN:  Marcellus Scott, MD     DATE OF BIRTH:  Aug 14, 1920  DATE OF ADMISSION:  05/28/2011 DATE OF DISCHARGE:                             HISTORY & PHYSICAL   PRIMARY CARE PHYSICIAN:  Karie Schwalbe, MD of [facility] at [facility].  CHIEF COMPLAINT:  "I do not feel well" and abdominal pain.  HISTORY OF PRESENTING ILLNESS:  Mr. [name] is an 77 year old male patient who was transferred from the primary care physician's office to the [facility] Emergency Room for further evaluation and management. The patient is a poor historian and seems to forget quite a bit of his history.  History is gathered in speaking to him as well as from the primary care physician's transfer note and the ED physician's note.  Mr. [name] is an 77 year old male patient with history of hypertension, chronic kidney disease, postherpetic neuralgia and atrial fibrillation, who presented to the primary care physician's office, complaining of not feeling well.  This apparently has been going on for about a week. According to his daughter's description to the PCP, he has not been eating much.  He has also complained of intermittent diffuse abdominal pain.  He is not able to characterize the quality nor is he able to tell of aggravating or relieving factors.  He, however, indicates that he does not have any nausea or vomiting or diarrhea.  He denies fevers or chills.  He does complain of dysuria but no frequency.  He denies dyspnea or chest pain or cough.  For these complaints, he was seen at the PCP's office where he was found to have heart rate in the 180s, suggestive of atrial fibrillation with rapid ventricular response.  His case was discussed with the [facility] Cardiology Team and the patient was transferred to the [facility].   Currently, the patient is on a Cardizem drip at 5 mg/hour and his heart rate is controlled in the 80s.  PAST MEDICAL HISTORY: 1. Hypertension. 2. Chronic kidney disease. 3. Postherpetic neuralgia. 4. Atrial fibrillation.  PAST SURGICAL HISTORY:  Appendectomy.  ALLERGIES:  No known drug allergies.  HOME MEDICATIONS: 1. Aspirin 81 mg p.o. daily. 2. Thiamine over the counter 1 tablet p.o. daily.  FAMILY HISTORY:  The patient denies any family history.  SOCIAL HISTORY:  The patient is widowed.  He is a retired Actor.  He lives with his daughter.  He indicates that he ambulates with the help of a walker.  He quit smoking more than 30 years ago.  He occasionally drinks a beer.  There is no history of drug abuse.  ADVANCED DIRECTIVES:  The patient wishes to be a full code.  REVIEW OF SYSTEMS:  All systems were reviewed and apart from history of presenting illness is negative.  The patient denies any history of falls.  He did volunteer to urinary stream being slow and trouble initiating micturition.  PHYSICAL EXAMINATION:  GENERAL:  Mr. [name] is a thinly built and nourished male patient who looks chronically ill  and unkempt.  He is in no obvious distress. VITAL SIGNS:  Temperature 97.3 degrees Fahrenheit, blood pressure 106/58 mmHg, pulse 84 per minute, irregularly irregular, respirations 16 per minute, and saturating at 98% on room air. HEAD, EYE, AND ENT:  Nontraumatic, normocephalic.  Pupils equal and reacting to light and accommodation.  Bilateral immature cataracts. Oral cavity is edentulous and mucosa is dry, but no other acute findings. NECK:  Supple.  No JVD or carotid bruits. LYMPHATICS:  No lymphadenopathy. RESPIRATORY SYSTEM:  Clear to auscultation and no increased work of breathing. CARDIOVASCULAR SYSTEM:  First and second heart sounds heard. Irregularly irregular.  No JVD or murmurs. ABDOMEN:  Nondistended.  Diffuse mild tenderness, but no rigidity, guarding, or  rebound.  Normal bowel sounds heard.  No organomegaly or mass appreciated. CENTRAL NERVOUS SYSTEM:  The patient is awake, alert, and oriented to person, partly to place and time.  No focal neurological deficits. EXTREMITIES:  Grade 5/5 power. SKIN:  The patient has diffuse multiple hyperpigmented macular lesions which appeared chronic on his trunk.  LAB DATA:  Urinalysis shows too numerous to count white blood cells and many bacteria, 100 mg/dL of protein, 15 ketones, negative for glucose, and large amount of blood.  Venous lactic acid was 3.4.  Comprehensive metabolic panel significant for sodium 137, potassium 4.6, chloride 102, bicarbonate 16, glucose 121, BUN 57, creatinine 2.41, and albumin of 2.1.  Stool is positive for occult blood.  CBC shows hemoglobin 13, hematocrit 38.4, white blood cell 11.8, platelets 197.  INR is 1.21. Troponin x1 is 0.02.  IMAGING: 1. CT of the abdomen and pelvis without contrast,  impression,     markedly abnormal appearance of the left kidney.  The left     intrarenal collecting system and pelvis are not normal nor is the     proximal left urethra.  Evaluation is complicated by the lack of IV     contrast, but the left ureter remains dilated to about its mid     segment where there is some apparent fairly bulky mesenteric     lymphadenopathy/soft tissue lesions, one of which is posteriorly     oriented along the peritoneal lining at the level where the left     ureteral has its major transition point.  This could be secondary     to some type of renal neoplasm with obstruction such as     transitional cell tumor.  Changes in the left kidney may be related     to both tumor and obstruction.  Left renal infection behind the     obstruction would also be a consideration.  The patient is noted to     have a distal ureteral stone on the left, but this stone was distal     to the major ureteral transition zone and apparent lymphadenopathy.      Cholelithiasis.  Air in the urinary bladder. 2. Chest x-ray.  Impression:     a.     Biapical scarring with nodule versus asymmetric osseous      density overlapping the left apex.     b.     No acute findings demonstrated. 3. EKG at the PCP's office had shown atrial flutter with 2 in 1 block     at a rate of 167 beats per minute.  EKG at the emergency room shows     atrial fibrillation at 116 beats per minute.  No acute ischemic     changes.  ASSESSMENT AND  PLAN: 1. Abdominal pain.  Differential diagnosis include secondary to     urinary tract infection and question pyelonephritis, rule out     obstructing mass versus lymphadenopathy including renal cell     carcinoma versus ureteral stone.  We will admit the patient to     telemetry.  Provide clear liquids orally as tolerated.  We will     place a Foley catheter and provide pain medications.  The ED     physician is consulting the urologist.  One of the other     differentials was mesenteric ischemia secondary to embolic event.     However, this was a noncontrasted CT and per the ED's discussion     with the radiologist, there is a low index of suspicion for     mesenteric ischemia. 2. Urinary tract infection/question acute pyelonephritis.  We will     obtain urine culture and continue IV Rocephin started by the     emergency department. 3. Dehydration secondary to urinary tract infection, poor oral intake,     for IV fluids. 4. Acute on chronic kidney disease.  The patient's baseline creatinine     is probably 1.8.  Acute renal failure is secondary to dehydration     and possible obstruction.  We will hydrate with IV fluids and     monitor daily basic metabolic panel.  Foley catheter will be     placed. 5. Anion gap metabolic acidosis secondary to problem #4 as well as     lactic acidosis.  For IV fluids and monitor daily basic metabolic     panel. 6. Atrial fibrillation with rapid ventricular rate.  [facility] [place] was  consulted.  IV diltiazem will be continued.  We will     cycle cardiac enzymes and obtain 2D echocardiograms without     contrast, also to determine whether the patient is going to be on     anticoagulation. 7. Hypertension.  For now, continue IV Cardizem.  The patient's blood     pressures are soft. 8. Full code status.  Time taken in coordinating this history and physical note is 60 minutes.     Marcellus Scott, MD     AH/MEDQ  D:  05/28/2011  T:  05/29/2011  Job:  [id number]  cc:   Karie Schwalbe, MD  Electronically Signed by Marcellus Scott MD on 06/01/2011 12:08:56 PM

## 2011-06-03 ENCOUNTER — Telehealth: Payer: Self-pay

## 2011-06-03 ENCOUNTER — Encounter (HOSPITAL_COMMUNITY): Payer: Self-pay | Admitting: Urology

## 2011-06-03 ENCOUNTER — Inpatient Hospital Stay: Payer: Self-pay | Admitting: Internal Medicine

## 2011-06-03 ENCOUNTER — Ambulatory Visit: Payer: Self-pay | Admitting: Internal Medicine

## 2011-06-03 DIAGNOSIS — C689 Malignant neoplasm of urinary organ, unspecified: Secondary | ICD-10-CM

## 2011-06-03 HISTORY — DX: Malignant neoplasm of urinary organ, unspecified: C68.9

## 2011-06-03 NOTE — Telephone Encounter (Signed)
Dr Vikki Ports did courtesy call to let Dr Alphonsus Sias know pt was discharged today. If Dr Alphonsus Sias would like to speak with Dr Vikki Ports he can be paged at [phone]

## 2011-06-03 NOTE — Telephone Encounter (Signed)
I will I am planning to do a home visit on Wednesday afternoon ---the 14th

## 2011-06-03 NOTE — Telephone Encounter (Signed)
Left message with [facility] home [facility]

## 2011-06-03 NOTE — Telephone Encounter (Signed)
[facility] calling asking if Dr.Letvak will continue to follow patient now that he has been discharged to hospice? [facility] already did referral for hospice. Please advise

## 2011-06-04 NOTE — Discharge Summary (Signed)
NAMEABBOTT, JASINSKI NO.:  [id number]  MEDICAL RECORD NO.:  [id number]  LOCATION:  3738                         FACILITY:  [facility]  PHYSICIAN:  Brendia Sacks, MD    DATE OF BIRTH:  [birth year]  DATE OF ADMISSION:  05/28/2011 DATE OF DISCHARGE:  06/03/2011                              DISCHARGE SUMMARY   PRIMARY CARE PHYSICIAN:  Karie Schwalbe, MD  CONDITION ON DISCHARGE:  Improved.  DISPOSITION:  Home with Home Hospice.  DISCHARGE DIAGNOSES: 1. New diagnosis of urothelial tumor with metastatic disease.  No     further evaluation requested by the patient.  Home with hospice. 2. Atrial fibrillation, rapid ventricular response, resolved. 3. Chronic kidney disease, stage III, stable. 4. Home with Hospice.  HISTORY OF PRESENT ILLNESS:  This is an 77 year old man who was admitted May 28, 2011, for abdominal pain and atrial fibrillation with rapid ventricular response.  HOSPITAL COURSE:  Mr. Munyan was admitted and treated for his atrial fibrillation with rapid ventricular response.  This responded well to Cardizem.  However, he was unable to tolerate oral agents as these caused bradycardia and pauses.  Therefore, he was taken off all rate- control agents and has had no need for any as-needed medications for more than 48 hours.  He is followed by Cardiology at this point, so recommendation to monitor his rate without rate-control agents.  He has indeed been rate controlled without agents from 148 hours.  Abdominal imaging was concerning for a tumor and MRI did indeed confirm urothelial tumor with metastatic disease.  He was seen in consultation with Dr. Sherron Monday of Urology and the patient was also seen in conjunction with Palliative Medicine Team.  He likely to pursue no further evaluation with which Dr. Sherron Monday agreed and he has requested to go home with Home Hospice and no further evaluation.  His pain has been well controlled.  He requested  his foley catheter be removed. I did discuss discharge home with his daughter. I doubt he will need supplemental oxygen, but we will check his oxygenation on room air today.  CONSULTATIONS: 1. Neurology.  Their recommendations as above. 2. Palliative Medicine.  Recommendations as above.  The patient is     DNR.  IMAGING: 1. Chest x-ray May 28, 2011:  Biapical scarring with nodule versus     asymmetric osseous density overlapping the left apex.  As the     patient is now comfort care and going home with hospice, no further     evaluation is recommended. 2. CT of the abdomen and pelvis performed on May 28, 2011:     Markedly abnormal-appearing left kidney.  Evaluation complicated by     lack of IV contrast. 3. MRI of the abdomen May 30, 2011:  Abnormal soft tissue within     the dilated left renal collecting system and proximal ureter,     likely reflecting urothelial tumor.  Suspected local tumor     extension past the expected contours of the kidney and made ureter.     Multiple hepatic lesions incompletely characterized at least 2 of     which are worrisome for hepatic metastases.  MICROBIOLOGY:  Urine culture is greater than 100,000 colonies per mL and multiple bacterial morphotypes present, none predominant.  ANCILLARY STUDIES:  2D echocardiogram on May 30, 2011:  Left ventricular ejection fraction 65% to 70%.  Normal wall motion.  No regional wall motion abnormalities.  PERTINENT LABORATORY STUDIES: 1. TSH elevated at 8.2. 2. Hemoglobin A1c was 6.2. 3. Free T4 within normal limits at 0.82, T3 low at 1.5. 4. C. difficile is negative. 5. CBC notable for hemoglobin of 9.9, which has been stable during     this hospitalization. 6. Basic metabolic panel notable for creatinine of 1.78 on discharge,     which appears to be at the patient's baseline based on studies from     February of this year as well as November of last year.  PHYSICAL EXAMINATION:  GENERAL:   On discharge, the patient is feeling well.  He has no complaints.  Pain is controlled. VITAL SIGNS:  Temperature is 98.3, pulse 97, respirations 20, blood pressure 137/69, saturation 96% on 2 L. CARDIOVASCULAR:  Regular rate and rhythm.  No murmur, rub, or gallop. RESPIRATORY:  Clear to auscultation bilaterally.  No wheezes, rales, or rhonchi.  Normal respiratory effort. EXTREMITIES:  1+ bilateral lower extremity edema.  BRIEF DISCHARGE INSTRUCTIONS:  The patient will be discharged home today with home hospice and will be tested to see whether he needs supplemental oxygen at home.  Increase activity slowly.  Diet is unrestricted.  Follow up with Dr. Alphonsus Sias as needed.  I have left a message for Dr. Alphonsus Sias.  I was unable to reach him by phone, but I have left a message with this practice with my pager number should he wish to contact me for further details.  DISCHARGE MEDICATIONS: 1. Acetaminophen 325 mg 2 tablets by mouth every 4 hours as needed for     pain, mild to moderate. 2. Chloraseptic spray, 1 spray topically as needed for sore throat. 3. Gabapentin 300 mg p.o. t.i.d. 4. Levothyroxine 25 mcg p.o. daily. 5. Morphine 20 mg/mL, 2.5 mg p.o. or sublingual every 4 hours as     needed for moderate-to-severe pain. 6. Aspirin 81 mg p.o. daily. 7. Vitamin B1 p.o. daily.   Time coordinating discharge is 40 minutes.     Brendia Sacks, MD     DG/MEDQ  D:  06/03/2011  T:  06/03/2011  Job:  [id number]  cc:   Karie Schwalbe, MD  Electronically Signed by Brendia Sacks  on 06/04/2011 06:35:48 PM

## 2011-06-15 ENCOUNTER — Ambulatory Visit: Payer: Medicare Other | Admitting: Internal Medicine

## 2011-06-21 ENCOUNTER — Telehealth: Payer: Self-pay | Admitting: *Deleted

## 2011-06-21 NOTE — Telephone Encounter (Signed)
Home health nurse with [facility] called to report that pt has declined their services.  A family member is going to talk to his brother, who they believe can probably talk him into it.  Also, pt has appt to see you next week, so perhaps you can say something to him as well.

## 2011-06-22 NOTE — Telephone Encounter (Signed)
Okay Didn't know he had made it out of the nursing home Glad to hear and will review his needs May be hospice appropriate

## 2011-06-29 ENCOUNTER — Ambulatory Visit: Payer: Medicare Other | Admitting: Internal Medicine

## 2011-07-03 ENCOUNTER — Ambulatory Visit: Payer: Self-pay | Admitting: Internal Medicine

## 2011-08-11 ENCOUNTER — Ambulatory Visit (INDEPENDENT_AMBULATORY_CARE_PROVIDER_SITE_OTHER): Payer: Medicare Other | Admitting: Internal Medicine

## 2011-08-11 ENCOUNTER — Encounter: Payer: Self-pay | Admitting: Internal Medicine

## 2011-08-11 DIAGNOSIS — C689 Malignant neoplasm of urinary organ, unspecified: Secondary | ICD-10-CM

## 2011-08-11 DIAGNOSIS — C7910 Secondary malignant neoplasm of unspecified urinary organs: Secondary | ICD-10-CM | POA: Insufficient documentation

## 2011-08-11 DIAGNOSIS — R609 Edema, unspecified: Secondary | ICD-10-CM

## 2011-08-11 DIAGNOSIS — I4891 Unspecified atrial fibrillation: Secondary | ICD-10-CM

## 2011-08-11 MED ORDER — FUROSEMIDE 20 MG PO TABS: ORAL_TABLET | ORAL | Status: DC

## 2011-08-11 NOTE — Progress Notes (Signed)
  Subjective:    Patient ID: [name], male    DOB: May 18, 1921, 78 y.o.   MRN: [id number]  HPI 1st visit since sending to ER with rapid atrial fib Diagnosed with metastatic urothelial tumor but he doesn't really remember about this Went home and then admitted to [facility] Then some time in rehab Back home for some time  Lives with daughter They do shopping and chores together  Having swelling still in right leg and foot Some pain and tightness there Now having some swelling on left also No calf pain but some tightness below knee  No chest pain No SOB No palpitations  Current Outpatient Prescriptions on File Prior to Visit  Medication Sig Dispense Refill  . aspirin 81 MG tablet Take 81 mg by mouth daily.          No Known Allergies  Past Medical History  Diagnosis Date  . Hypertension   . Post herpetic neuralgia   . Atrial fibrillation   . Renal insufficiency   . Urothelial cancer 11/12    Past Surgical History  Procedure Date  . Appendectomy     Family History  Problem Relation Age of Onset  . Coronary artery disease Neg Hx   . Diabetes Neg Hx   . Hypertension Neg Hx   . Cancer Neg Hx     History   Social History  . Marital Status: Widowed    Spouse Name: N/A    Number of Children: 1  . Years of Education: N/A   Occupational History  . retired Actor    Social History Main Topics  . Smoking status: Former Smoker    Quit date: 08/03/1943  . Smokeless tobacco: Never Used  . Alcohol Use: Yes     occasional  . Drug Use: Not on file  . Sexually Active: Not on file   Other Topics Concern  . Not on file   Social History Narrative  . No narrative on file   Review of Systems No trouble voiding No hematuria Appetite is better----has gained back a fair bit of weight     Objective:   Physical Exam  Constitutional: He appears well-developed and well-nourished. No distress.  Neck: Normal range of motion. No thyromegaly present.  Cardiovascular:  Normal rate, regular rhythm and normal heart sounds.  Exam reveals no gallop.   No murmur heard.      Rare skip beats Faint pedal pulses  Pulmonary/Chest: Effort normal and breath sounds normal. No respiratory distress. He has no wheezes. He has no rales.  Musculoskeletal: He exhibits edema.       1+ pitting edema in feet to mid calf Right >left No calf tenderness  Lymphadenopathy:    He has no cervical adenopathy.  Skin: No rash noted. There is erythema.       Red venous stasis changes without sig heat or tenderness          Assessment & Plan:

## 2011-08-11 NOTE — Assessment & Plan Note (Signed)
This is fairly new for him Back in apparent sinus rhythm so I am concerned about intraabdominal venous obstruction with cancer diagnosis Could just be venous insufficiency Will try low dose furosemide, support socks, salt restriction

## 2011-08-11 NOTE — Patient Instructions (Signed)
Please stop using all salt in your food. Get knee high support socks and wear them daily --but remove at bedtime Try the fluid medications but don't take more than 1 every other day

## 2011-08-11 NOTE — Assessment & Plan Note (Signed)
Noted during his admit to [facility] Referred to hospice but not on now Paradoxically is better now---could this have been an incidental finding and his decline was due to the rapid atrial fib??  Rx not appropriate and he isn't interested May need to reconsider hospice if declines again

## 2011-08-11 NOTE — Assessment & Plan Note (Signed)
Now in apparent sinus rhythm again On ASA No other meds---I am uncomfortable with this but he is unstable and at risk with any meds so I would hold off

## 2011-09-07 ENCOUNTER — Ambulatory Visit (INDEPENDENT_AMBULATORY_CARE_PROVIDER_SITE_OTHER): Payer: Medicare Other | Admitting: Internal Medicine

## 2011-09-07 ENCOUNTER — Encounter: Payer: Self-pay | Admitting: Internal Medicine

## 2011-09-07 VITALS — BP 150/68 | HR 66 | Temp 97.3°F | Ht 70.0 in | Wt 150.0 lb

## 2011-09-07 DIAGNOSIS — I4891 Unspecified atrial fibrillation: Secondary | ICD-10-CM

## 2011-09-07 DIAGNOSIS — R609 Edema, unspecified: Secondary | ICD-10-CM

## 2011-09-07 DIAGNOSIS — C689 Malignant neoplasm of urinary organ, unspecified: Secondary | ICD-10-CM

## 2011-09-07 DIAGNOSIS — I10 Essential (primary) hypertension: Secondary | ICD-10-CM

## 2011-09-07 LAB — CBC WITH DIFFERENTIAL/PLATELET
Basophils Relative: 0.6 % (ref 0.0–3.0)
Eosinophils Relative: 1.6 % (ref 0.0–5.0)
Lymphocytes Relative: 12.4 % (ref 12.0–46.0)
Monocytes Absolute: 0.6 10*3/uL (ref 0.1–1.0)
Monocytes Relative: 7.1 % (ref 3.0–12.0)
Neutrophils Relative %: 78.3 % — ABNORMAL HIGH (ref 43.0–77.0)
Platelets: 326 10*3/uL (ref 150.0–400.0)
RBC: 3.71 Mil/uL — ABNORMAL LOW (ref 4.22–5.81)
WBC: 8 10*3/uL (ref 4.5–10.5)

## 2011-09-07 LAB — BASIC METABOLIC PANEL
BUN: 35 mg/dL — ABNORMAL HIGH (ref 6–23)
Calcium: 8.7 mg/dL (ref 8.4–10.5)
Chloride: 102 mEq/L (ref 96–112)
Creatinine, Ser: 2.1 mg/dL — ABNORMAL HIGH (ref 0.4–1.5)
GFR: 32.31 mL/min — ABNORMAL LOW (ref >60.00)

## 2011-09-07 NOTE — Assessment & Plan Note (Signed)
Improved with the furosemide Check labs

## 2011-09-07 NOTE — Assessment & Plan Note (Signed)
BP Readings from Last 3 Encounters:  09/07/11 150/68  08/11/11 160/60  05/28/11 86/58   Okay without meds Would not start any at this level

## 2011-09-07 NOTE — Assessment & Plan Note (Signed)
Sounds normal now On ASA only No meds otherwise Not a coumadin candidate with the cancer

## 2011-09-07 NOTE — Progress Notes (Signed)
  Subjective:    Patient ID: [name], male    DOB: [birth year], 78 y.o.   MRN: [id number]  HPI Doing well   no heart problems No racing or palpitations No chest pain No DOE---still does shopping, housework along with daughter  No urinary problems No trouble initiating flow No hematuria  Not much swelling now Uses the furosemide every other day  Current Outpatient Prescriptions on File Prior to Visit  Medication Sig Dispense Refill  . aspirin 81 MG tablet Take 81 mg by mouth daily.        . furosemide (LASIX) 20 MG tablet 1 tablet every other day as needed for leg swelling  30 tablet  0    No Known Allergies  Past Medical History  Diagnosis Date  . Hypertension   . Post herpetic neuralgia   . Atrial fibrillation   . Renal insufficiency   . Urothelial cancer 11/12    Past Surgical History  Procedure Date  . Appendectomy     Family History  Problem Relation Age of Onset  . Coronary artery disease Neg Hx   . Diabetes Neg Hx   . Hypertension Neg Hx   . Cancer Neg Hx     History   Social History  . Marital Status: Widowed    Spouse Name: N/A    Number of Children: 1  . Years of Education: N/A   Occupational History  . retired Actor    Social History Main Topics  . Smoking status: Former Smoker    Quit date: 08/03/1943  . Smokeless tobacco: Never Used  . Alcohol Use: Yes     occasional  . Drug Use: Not on file  . Sexually Active: Not on file   Other Topics Concern  . Not on file   Social History Narrative  . No narrative on file   Review of Systems Appetite is fine Weight down slightly Sleeps well     Objective:   Physical Exam  Constitutional: He appears well-developed. No distress.  Neck: Normal range of motion.  Cardiovascular: Normal rate, regular rhythm and normal heart sounds.  Exam reveals no gallop.   No murmur heard. Pulmonary/Chest: Effort normal and breath sounds normal. No respiratory distress. He has no wheezes. He has no  rales.  Musculoskeletal: He exhibits no edema and no tenderness.  Lymphadenopathy:    He has no cervical adenopathy.  Neurological:       No focal weakness Some balance issues when putting shirt back in ---used wall for balance appropriately  Psychiatric: He has a normal mood and affect. His behavior is normal. Judgment and thought content normal.          Assessment & Plan:

## 2011-09-07 NOTE — Assessment & Plan Note (Signed)
No current symptoms He again states he doesn't want Rx Will observe only

## 2011-09-10 ENCOUNTER — Encounter: Payer: Self-pay | Admitting: *Deleted

## 2011-10-25 ENCOUNTER — Emergency Department: Payer: Self-pay | Admitting: Emergency Medicine

## 2012-01-05 ENCOUNTER — Ambulatory Visit (INDEPENDENT_AMBULATORY_CARE_PROVIDER_SITE_OTHER): Payer: Medicare Other | Admitting: Internal Medicine

## 2012-01-05 ENCOUNTER — Encounter: Payer: Self-pay | Admitting: Internal Medicine

## 2012-01-05 VITALS — BP 160/70 | HR 69 | Temp 98.3°F | Ht 70.0 in | Wt 151.0 lb

## 2012-01-05 DIAGNOSIS — I10 Essential (primary) hypertension: Secondary | ICD-10-CM

## 2012-01-05 DIAGNOSIS — C689 Malignant neoplasm of urinary organ, unspecified: Secondary | ICD-10-CM

## 2012-01-05 DIAGNOSIS — I4891 Unspecified atrial fibrillation: Secondary | ICD-10-CM

## 2012-01-05 DIAGNOSIS — B0229 Other postherpetic nervous system involvement: Secondary | ICD-10-CM

## 2012-01-05 MED ORDER — GABAPENTIN 300 MG PO CAPS: 300.0000 mg | ORAL_CAPSULE | Freq: Every day | ORAL | Status: DC

## 2012-01-05 NOTE — Patient Instructions (Signed)
Please start the gabapentin at bedtime for the shingles pain. Try 1 capsule at first. If no problems with it but the pain continues, go up to 2 capsules after a week

## 2012-01-05 NOTE — Assessment & Plan Note (Signed)
Clearly worse Will try gabapentin again at bedtime

## 2012-01-05 NOTE — Assessment & Plan Note (Signed)
Still regular No meds for now but if BP stays up, would consider low dose beta blocker On ASA

## 2012-01-05 NOTE — Assessment & Plan Note (Signed)
No evidence this is active at present

## 2012-01-05 NOTE — Assessment & Plan Note (Signed)
BP Readings from Last 3 Encounters:  01/05/12 160/70  09/07/11 150/68  08/11/11 160/60   Up today but in pain from zoster Consider beta blocker if goes higher (risks>benefits for him I believe at this point)

## 2012-01-05 NOTE — Progress Notes (Signed)
  Subjective:    Patient ID: LASON EVELAND, male    DOB: 02-19-1921, 78 y.o.   MRN: [id number]  HPI Still has right shoulder post herpetic neuralgia Especially bad when it is going to rain Hurts day and night Pretty bad at times Stinging and burning goes up to right ear  No chest pain No SOB No palpitations No edema Still independent with ADLs and instrumental ADLs Does his yard work  No problems voiding  Current Outpatient Prescriptions on File Prior to Visit  Medication Sig Dispense Refill  . aspirin 81 MG tablet Take 81 mg by mouth daily.        . furosemide (LASIX) 20 MG tablet 1 tablet every other day as needed for leg swelling  30 tablet  0    No Known Allergies  Past Medical History  Diagnosis Date  . Hypertension   . Post herpetic neuralgia   . Atrial fibrillation   . Renal insufficiency   . Urothelial cancer 11/12    Past Surgical History  Procedure Date  . Appendectomy     Family History  Problem Relation Age of Onset  . Coronary artery disease Neg Hx   . Diabetes Neg Hx   . Hypertension Neg Hx   . Cancer Neg Hx     History   Social History  . Marital Status: Widowed    Spouse Name: N/A    Number of Children: 1  . Years of Education: N/A   Occupational History  . retired Actor    Social History Main Topics  . Smoking status: Former Smoker    Quit date: 08/03/1943  . Smokeless tobacco: Never Used  . Alcohol Use: Yes     occasional  . Drug Use: Not on file  . Sexually Active: Not on file   Other Topics Concern  . Not on file   Social History Narrative  . No narrative on file   Review of Systems Sleeps fair but the pain wakes him up Appetite is okay Weight is stable    Objective:   Physical Exam  Constitutional: He appears well-developed and well-nourished. No distress.  Neck: Normal range of motion. Neck supple.       Sensitive along right SCM muscle  Cardiovascular: Normal rate, regular rhythm and normal heart sounds.  Exam  reveals no gallop.   No murmur heard. Pulmonary/Chest: Effort normal and breath sounds normal. No respiratory distress. He has no wheezes. He has no rales.  Abdominal: Soft. There is no tenderness.  Musculoskeletal: He exhibits no edema and no tenderness.  Lymphadenopathy:    He has no cervical adenopathy.  Psychiatric: He has a normal mood and affect. His behavior is normal.          Assessment & Plan:

## 2012-03-28 ENCOUNTER — Encounter: Payer: Self-pay | Admitting: Internal Medicine

## 2012-03-28 ENCOUNTER — Ambulatory Visit (INDEPENDENT_AMBULATORY_CARE_PROVIDER_SITE_OTHER): Payer: Medicare Other | Admitting: Internal Medicine

## 2012-03-28 VITALS — BP 160/60 | HR 65 | Temp 97.2°F | Ht 70.0 in | Wt 151.0 lb

## 2012-03-28 DIAGNOSIS — I4891 Unspecified atrial fibrillation: Secondary | ICD-10-CM

## 2012-03-28 DIAGNOSIS — C689 Malignant neoplasm of urinary organ, unspecified: Secondary | ICD-10-CM

## 2012-03-28 DIAGNOSIS — R609 Edema, unspecified: Secondary | ICD-10-CM

## 2012-03-28 LAB — CBC WITH DIFFERENTIAL/PLATELET
Basophils Relative: 0.8 % (ref 0.0–3.0)
Eosinophils Absolute: 0.3 10*3/uL (ref 0.0–0.7)
HCT: 34.2 % — ABNORMAL LOW (ref 39.0–52.0)
Hemoglobin: 10.9 g/dL — ABNORMAL LOW (ref 13.0–17.0)
Lymphocytes Relative: 14 % (ref 12.0–46.0)
Lymphs Abs: 0.9 10*3/uL (ref 0.7–4.0)
MCHC: 32 g/dL (ref 30.0–36.0)
Monocytes Relative: 8.4 % (ref 3.0–12.0)
Neutro Abs: 4.5 10*3/uL (ref 1.4–7.7)
RBC: 3.87 Mil/uL — ABNORMAL LOW (ref 4.22–5.81)

## 2012-03-28 LAB — BASIC METABOLIC PANEL
CO2: 22 mEq/L (ref 19–32)
Calcium: 8.9 mg/dL (ref 8.4–10.5)
Chloride: 108 mEq/L (ref 96–112)
Glucose, Bld: 92 mg/dL (ref 70–99)
Potassium: 5.2 mEq/L — ABNORMAL HIGH (ref 3.5–5.1)
Sodium: 139 mEq/L (ref 135–145)

## 2012-03-28 LAB — HEPATIC FUNCTION PANEL
ALT: 7 U/L (ref 0–53)
Albumin: 3.2 g/dL — ABNORMAL LOW (ref 3.5–5.2)
Alkaline Phosphatase: 99 U/L (ref 39–117)
Bilirubin, Direct: 0 mg/dL (ref 0.0–0.3)
Total Protein: 7.5 g/dL (ref 6.0–8.3)

## 2012-03-28 LAB — T4, FREE: Free T4: 0.71 ng/dL (ref 0.60–1.60)

## 2012-03-28 MED ORDER — FUROSEMIDE 40 MG PO TABS: 40.0000 mg | ORAL_TABLET | Freq: Every day | ORAL | Status: DC

## 2012-03-28 NOTE — Assessment & Plan Note (Signed)
Still is regular No Rx except aspirin

## 2012-03-28 NOTE — Assessment & Plan Note (Signed)
Mildly worse Will increase furosemide Urged him to wear support socks Check labs

## 2012-03-28 NOTE — Progress Notes (Signed)
  Subjective:    Patient ID: [name], male    DOB: 1921-04-22, 78 y.o.   MRN: [id number]  HPI Has swelling and soreness in legs He doesn't remember how they were bad in the past (in hospital) but daughter reminds him  Has been using the fluid pill--as far as we can figure (he didn't bring his pills in)  Notes ring around his socks No sig difference in AM or PM Doesn't wear support socks  Breathing is fine Sleeps flat--and no PND  Voids okay No hematuria Appetite is good Weight is stable  Current Outpatient Prescriptions on File Prior to Visit  Medication Sig Dispense Refill  . aspirin 81 MG tablet Take 81 mg by mouth daily.        . furosemide (LASIX) 20 MG tablet 1 tablet every other day as needed for leg swelling  30 tablet  0  . gabapentin (NEURONTIN) 300 MG capsule Take 1-2 capsules (300-600 mg total) by mouth at bedtime.  60 capsule  11    No Known Allergies  Past Medical History  Diagnosis Date  . Hypertension   . Post herpetic neuralgia   . Atrial fibrillation   . Renal insufficiency   . Urothelial cancer 11/12    Past Surgical History  Procedure Date  . Appendectomy     Family History  Problem Relation Age of Onset  . Coronary artery disease Neg Hx   . Diabetes Neg Hx   . Hypertension Neg Hx   . Cancer Neg Hx     History   Social History  . Marital Status: Widowed    Spouse Name: N/A    Number of Children: 1  . Years of Education: N/A   Occupational History  . retired Actor    Social History Main Topics  . Smoking status: Former Smoker    Quit date: 08/03/1943  . Smokeless tobacco: Never Used  . Alcohol Use: Yes     occasional  . Drug Use: Not on file  . Sexually Active: Not on file   Other Topics Concern  . Not on file   Social History Narrative  . No narrative on file   Review of Systems Sleeps okay No nocturia No chest pain No palpitations    Objective:   Physical Exam  Constitutional: He appears well-developed and  well-nourished. No distress.  Neck: Normal range of motion. Neck supple.  Cardiovascular: Normal rate and regular rhythm.        Opening snap at left LSB vs. S4  Pulmonary/Chest: Effort normal. No respiratory distress. He has no wheezes. He has no rales.       Slight dullness and decreased breath sounds at right base  Musculoskeletal: He exhibits edema.       1+ pitting edema in lower calves  Lymphadenopathy:    He has no cervical adenopathy.  Neurological:       Mild memory problems  Skin: Rash noted.       Mild venous stasis changes  Psychiatric: He has a normal mood and affect. His behavior is normal.          Assessment & Plan:

## 2012-03-28 NOTE — Patient Instructions (Signed)
Please increase the furosemide to 40mg  daily ----you can take 2 of the 20mg  tabs till they are gone  Please get support socks and wear them from morning till bedtime unless your feet are up

## 2012-03-28 NOTE — Assessment & Plan Note (Signed)
Again discussed this with he and daughter Could be related to the edema in intraabdominal venous thrombosis or mass obstructing drainage---but this seems unlikely Pleural effusion---not symptomatic  He again states he doesn't want to look into this No specific urinary symptoms or weight loss though

## 2012-03-31 ENCOUNTER — Telehealth: Payer: Self-pay | Admitting: Internal Medicine

## 2012-03-31 NOTE — Telephone Encounter (Signed)
Pt was returning call about labs from yesterday. Pt was informed of the results but would like Dee to call him back.

## 2012-03-31 NOTE — Telephone Encounter (Signed)
Spoke with patient and advised results   

## 2012-05-10 ENCOUNTER — Ambulatory Visit: Payer: Medicare Other | Admitting: Internal Medicine

## 2012-05-10 DIAGNOSIS — Z0289 Encounter for other administrative examinations: Secondary | ICD-10-CM

## 2013-02-13 ENCOUNTER — Telehealth: Payer: Self-pay

## 2013-02-13 NOTE — Telephone Encounter (Signed)
[name] request prescription for 4 wheel walker, (no seat needed); request cb when mailed to pt's home address. Pt said has not changed since last saw Dr Alphonsus Sias; needs walker to steady his legs when walking and goes to the store.Please advise.

## 2013-02-13 NOTE — Telephone Encounter (Signed)
.  left message to have patient return my call.  

## 2013-02-13 NOTE — Telephone Encounter (Signed)
Order mailed to home address

## 2013-02-13 NOTE — Telephone Encounter (Signed)
Rx written Medicare won't pay for this but with the prescription they shouldn't have to pay tax

## 2013-04-20 ENCOUNTER — Other Ambulatory Visit: Payer: Self-pay | Admitting: *Deleted

## 2013-04-20 MED ORDER — FUROSEMIDE 40 MG PO TABS: 40.0000 mg | ORAL_TABLET | Freq: Every day | ORAL | Status: DC

## 2013-06-15 ENCOUNTER — Ambulatory Visit: Payer: Medicare Other | Admitting: Internal Medicine

## 2013-06-16 ENCOUNTER — Observation Stay: Payer: Self-pay | Admitting: Orthopedic Surgery

## 2013-06-16 ENCOUNTER — Ambulatory Visit: Payer: Self-pay | Admitting: Orthopedic Surgery

## 2013-06-16 LAB — CBC WITH DIFFERENTIAL/PLATELET
Basophil #: 0 10*3/uL (ref 0.0–0.1)
Basophil %: 0.7 %
Eosinophil #: 0.1 10*3/uL (ref 0.0–0.7)
HGB: 13.7 g/dL (ref 13.0–18.0)
Lymphocyte #: 0.6 10*3/uL — ABNORMAL LOW (ref 1.0–3.6)
MCH: 31.4 pg (ref 26.0–34.0)
MCHC: 34.2 g/dL (ref 32.0–36.0)
Neutrophil #: 5.6 10*3/uL (ref 1.4–6.5)
RBC: 4.37 10*6/uL — ABNORMAL LOW (ref 4.40–5.90)
RDW: 13.4 % (ref 11.5–14.5)
WBC: 6.9 10*3/uL (ref 3.8–10.6)

## 2013-06-16 LAB — BASIC METABOLIC PANEL
BUN: 20 mg/dL — ABNORMAL HIGH (ref 7–18)
Chloride: 107 mmol/L (ref 98–107)
Creatinine: 2.01 mg/dL — ABNORMAL HIGH (ref 0.60–1.30)
EGFR (Non-African Amer.): 28 — ABNORMAL LOW
Glucose: 82 mg/dL (ref 65–99)
Osmolality: 277 (ref 275–301)

## 2013-06-19 ENCOUNTER — Ambulatory Visit: Payer: Medicare Other | Admitting: Internal Medicine

## 2014-11-22 NOTE — Discharge Summary (Signed)
PATIENT NAME:  [name], [name] MR#:  [id number] DATE OF BIRTH:  08-03-1920  DATE OF ADMISSION:  06/16/2013 DATE OF DISCHARGE:  06/17/2013  DISCHARGE DIAGNOSES:  1.  Left type 2 open hallux distal phalanx fracture.  2.  History of hypothyroidism.  3.  History of neuropathy.  4.  History of vitamin D deficiency.   PROCEDURE PERFORMED DURING HOSPITALIZATION: On 06/16/2013, I and D with open reduction and internal fixation left great toe distal phalanx fracture.   COMPLICATIONS DURING THE HOSPITALIZATION: None.   CONDITION ON DISCHARGE:  Stable.   BRIEF HOSPITAL COURSE: Mr. Lesinski was admitted for pain control and IV antibiotics following his operative treatment of his open left great toe fracture. By postoperative day #1, he had completed his IV antibiotics. He had cleared physical therapy criteria for discharge. Discharge plan was reviewed with his daughter and he was deemed to be stable for discharge.   DISCHARGE MEDICATIONS:  New medications will include Norco 5/325, 1 to 2 tabs p.o. q.4-6 hours p.r.n. pain; Keflex 250 mg p.o. t.i.d. for 5 days.   WOUND CARE: He is to leave his wound and dressing clean, dry and intact. He is to keep his boot on full-time. He may shower with his boot on with a bag and tape.   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 He is to elevate his left foot above his heart.   FOLLOWUP PLAN: He will be seen by Dr. Marry Guan on approximately 11/20 or 11/21. His daughter was instructed to call for a followup appointment on Monday.   He was also given IV antibiotics and tetanus in the Emergency Department upon his arrival at the time after his initial injury.   ____________________________ Maebelle Munroe, MD jfs:cs D: 06/17/2013 18:01:00 ET T: 06/17/2013 20:27:05 ET JOB#: [id number]  cc: Maebelle Munroe, MD, <Dictator> Maebelle Munroe MD ELECTRONICALLY SIGNED 06/18/2013 0:00

## 2014-11-22 NOTE — H&P (Signed)
PATIENT NAME:  [name], [name] MR#:  [id number] DATE OF BIRTH:  [birth year]  DATE OF ADMISSION:  06/16/2013  CHIEF COMPLAINT: Left great toe pain and deformity.   HISTORY OF PRESENT ILLNESS: Mr. Lovings is a 81 year old male who stubbed his toe on a floor threshold today. He had immediate pain and bleeding and was brought to [facility] Emergency Department where he was diagnosed with an open toe fracture, for which I was asked to provide definitive consultation.  REVIEW OF SYSTEMS:    NEUROLOGIC: No loss of consciousness.  RESPIRATORY: No shortness of breath.  CARDIAC: No chest pain.  SKIN: Laceration over the left great toe.  MUSCULOSKELETAL: Complains of left great toe pain.  GASTROINTESTINAL: No nausea or vomiting.   All other review of systems are negative.   PAST MEDICAL HISTORY:   PAST SURGICAL HISTORY: Remarkable for appendectomy.   CURRENT MEDICATIONS:   ALLERGIES: No known drug allergies.   SOCIAL HISTORY: Nonsmoker, nondrinker.   FAMILY HISTORY: No diabetes or heart disease.   PHYSICAL EXAMINATION: GENERAL: Pleasant, alert, elderly male appearing stated age, presenting with his daughter.  VITAL SIGNS: Listed on the Emergency Department [employer] record.  PSYCHIATRIC: Mood and affect appropriate.  HEENT: Normocephalic, atraumatic. Sclerae clear. Oral mucosa membranes are slightly dry.  LUNGS: Clear to auscultation bilaterally.  HEART: Distant heart tones. Regular rate and rhythm. Normal S1 and S2. No murmurs or gallops.  ABDOMEN: Soft, nontender, nondistended. Positive bowel sounds.  LYMPHATIC: Moderate swelling, left great toe.  SKIN: Transverse laceration just distal to the DIPJ with obvious deformity.  NEUROLOGIC: Light touch sensation examination is intact over the medial aspect of the great toe plantarly, though there is decreased light touch sensation over the lateral aspect of the plantar great toe. Motor examination difficult to assess secondary to open fracture.   VASCULAR: Capillary refill less than 2 seconds. Pulse oximetry is 94% distal to the fracture site at the left great toe tip.  MUSCULOSKELETAL: Obvious open fracture with plantar flexion deformity of the left great toe distal phalanx. Flexor and extensor function is difficult to assess secondary to open fracture. The fracture is just proximal to the base of the nail bed.   There is no pulsatile bleeding.   There is an abrasion transversely over the plantar aspect of the toe. This is not full-thickness.   RADIOGRAPHS: Reveal a comminuted transverse distal phalanx fracture.   IMPRESSION: Open distal phalanx fracture, left great toe.   PLAN: Tetanus and 1 gram of Ancef were given in the Emergency Department. We will proceed with I and D and percutaneous pinning of the left great toe fracture as well as admission for IV antibiotics. Risks, benefits and alternatives were discussed with the patient and his daughter to include, but not limited to bleeding, infection, damage to blood vessels and nerves, need for surgery and treatment, chronic pain, loss of function, stiffness, allergy, anesthetic risk, malunion, nonunion, need for hardware removal, heart, lung, brain, kidney complications.   He and his daughter appear to understand the risks and benefits and desire to proceed with operative treatment. We will proceed with surgery today as soon as we have operating room space.    ____________________________ Maebelle Munroe, MD jfs:jcm D: 06/16/2013 14:47:01 ET T: 06/16/2013 16:02:09 ET JOB#: [id number]  cc: Maebelle Munroe, MD, <Dictator> Maebelle Munroe MD ELECTRONICALLY SIGNED 06/17/2013 18:32

## 2014-11-22 NOTE — Discharge Summary (Signed)
Dates of Admission and Diagnosis:  Date of Admission 16-Jun-2013   Date of Discharge 01-Jan-0001   Admitting Diagnosis type II L great toe open distal phalanx fx   Final Diagnosis type II L great toe open distal phalanx fx    Chief Complaint/History of Present Illness see dictated note   Hospital Course:  Hospital Course see hosp chart   Condition on Discharge Stable   DISCHARGE INSTRUCTIONS HOME MEDS:  Medication Reconciliation: Patient's Home Medications at Discharge:     Medication Instructions  vitamin b12  1 tab(s) orally once a day   docusate-senna 50 mg-8.6 mg oral tablet  1 tab(s) orally 2 times a day   cephalexin 250 mg oral capsule  1 cap(s) orally every 8 hours   aspirin 325 mg oral delayed release tablet  1 tab(s) orally once a day   acetaminophen-hydrocodone 325 mg-5 mg oral tablet  1 tab(s) orally every 4 hours, As needed, pain    PRESCRIPTIONS: PRINTED AND PLACED ON CHART   Physician's Instructions:  Home Health? No   Treatments None   Dressing Care A dry bandage has been applied to your wound.  Keep this bandage clean and dry.  leave dressing along, keep clean/dry   Home Oxygen? No   Diet Regular   Diet Consistency Regular Consistency   Activity Limitations wt bearing as tolerated L leg, wear walker boot at all time   Return to Work Not Applicable   Time frame for Follow Up Appointment dr Marry Guan 11/20 or 11/21, call for appt   Electronic Signatures: Maebelle Munroe (MD)  (Signed [phone] 10:15)  Authored: ADMISSION DATE AND DIAGNOSIS, CHIEF COMPLAINT/HPI, HOSPITAL COURSE, [place], PATIENT INSTRUCTIONS   Last Updated: 16-Nov-14 10:15 by Maebelle Munroe (MD)

## 2014-11-22 NOTE — Op Note (Signed)
PATIENT NAME:  [name], PROWSE MR#:  [id number] DATE OF BIRTH:  [birth year]  DATE OF PROCEDURE:  06/16/2013  PREOPERATIVE DIAGNOSIS: Left great toe open type II distal phalanx fracture.   POSTOPERATIVE DIAGNOSIS: Left great toe open type II distal phalanx fracture with nail bed laceration.   PROCEDURES PERFORMED:  1.  Irrigation and debridement of type II open fracture left hallux distal phalanx 2.  Open reduction and internal fixation of left open distal phalanx fracture, left hallux.  3.  Nail bed repair.   SURGEON: Maebelle Munroe, MD   ASSISTANT: None.   COMPLICATIONS: None apparent.   ESTIMATED BLOOD LOSS: Less than 20 mL.   ANESTHESIA: Metatarsal block with moderate anesthesia care.  TOURNIQUET TIME: Not applicable.   IMPLANTS: 0.062-inch K wires x 2.   OPERATIVE FINDINGS: Minimal contamination and mild comminution, left open distal phalanx fracture of left hallux.   INDICATIONS: Mr. Dibella is a 81 year old male who sustained blunt trauma to his left hallux with resultant open distal phalanx fracture type II, indicating need for operative treatment. Risks, benefits, and alternatives were discussed with him and his daughter to include, but not limited to bleeding, infection, damage to blood vessels and nerves, need for surgery and treatment, chronic pain, loss of function, stiffness, allergy, anesthetic risk, malunion, nonunion, need for further procedure. He appeared to understand the risks and benefits and desired to proceed with operative treatment.   DESCRIPTION OF PROCEDURE: After positive identification of the patient in the preop holding area and after informed consent had been obtained and the correct operative site had been initialed by myself, the patient was taken to the operating room and placed in the supine position. 20 mL of 1:1 mix of 1% plain lidocaine and 0.25% plain Marcaine was used to anesthetize the left hallux with a metatarsal block with excellent resultant  anesthesia. IV sedation was administered. Tourniquet was placed on the left calf, though was not used. Timeout was performed prior to initiation of the metatarsal block. TEDs and sequentials were applied to the opposite leg. The left lower left lower extremity was prepped and draped in the usual sterile fashion. The fracture site was opened, curetted, and irrigated with 3 liters of Ancef-impregnated normal saline with nonpulsatile cystoscopy tubing. The nail plate was removed so as to effect a nail bed repair.   The fracture site was opened again, and two 0.062-inch K wires were then advanced in an antegrade fashion out the distalmost aspect of the distal phalanx. The fracture was then reduced manually, and the pins were then advanced across the interphalangeal joint, confirming adequate reduction of the fracture and position within bone under biplanar fluoroscopy. The nail bed was then repaired using 5-0 chromic with interrupted sutures with nice apposition of nail bed edges. The nail plate, which had been soaked in Betadine and then cleaned of all extraneous soft tissue, was then replaced into the eponychium and was then repaired using 3-0 nylon. The traumatic lacerations medially and laterally had been extended. These were also loosely repaired using 3-0 nylon in single interrupted suture fashion. Xeroform was then used. The pins were cut and bent, and sterile dressing was applied with 4 x 4, Kling, finger splint, and overlying 2-inch Ace wrap. The tourniquet was not used.   The patient was taken to the recovery room in satisfactory condition without apparent operative or anesthetic complication. One gram of IV Ancef was also given before any skin incision had been made in the operating room, as well  as 1 gram in the Emergency Department with tetanus update. These findings were related to his daughter as well as his need for followup and compliance.    ____________________________ Maebelle Munroe,  MD jfs:jcm D: 06/16/2013 17:33:20 ET T: 06/16/2013 20:36:28 ET JOB#: [id number]  cc: Maebelle Munroe, MD, <Dictator> Maebelle Munroe MD ELECTRONICALLY SIGNED 06/17/2013 18:34

## 2015-03-27 IMAGING — CR LEFT GREAT TOE
1 series · 3 of 3 positions shown · non-contrast
Comparison: None.

CLINICAL DATA: Fall on carpet.  Left toe pain.

EXAM:
LEFT GREAT TOE

[Series 1: ap · 0.17mm/px · 3 of 3 slices shown]
[im 1/3]
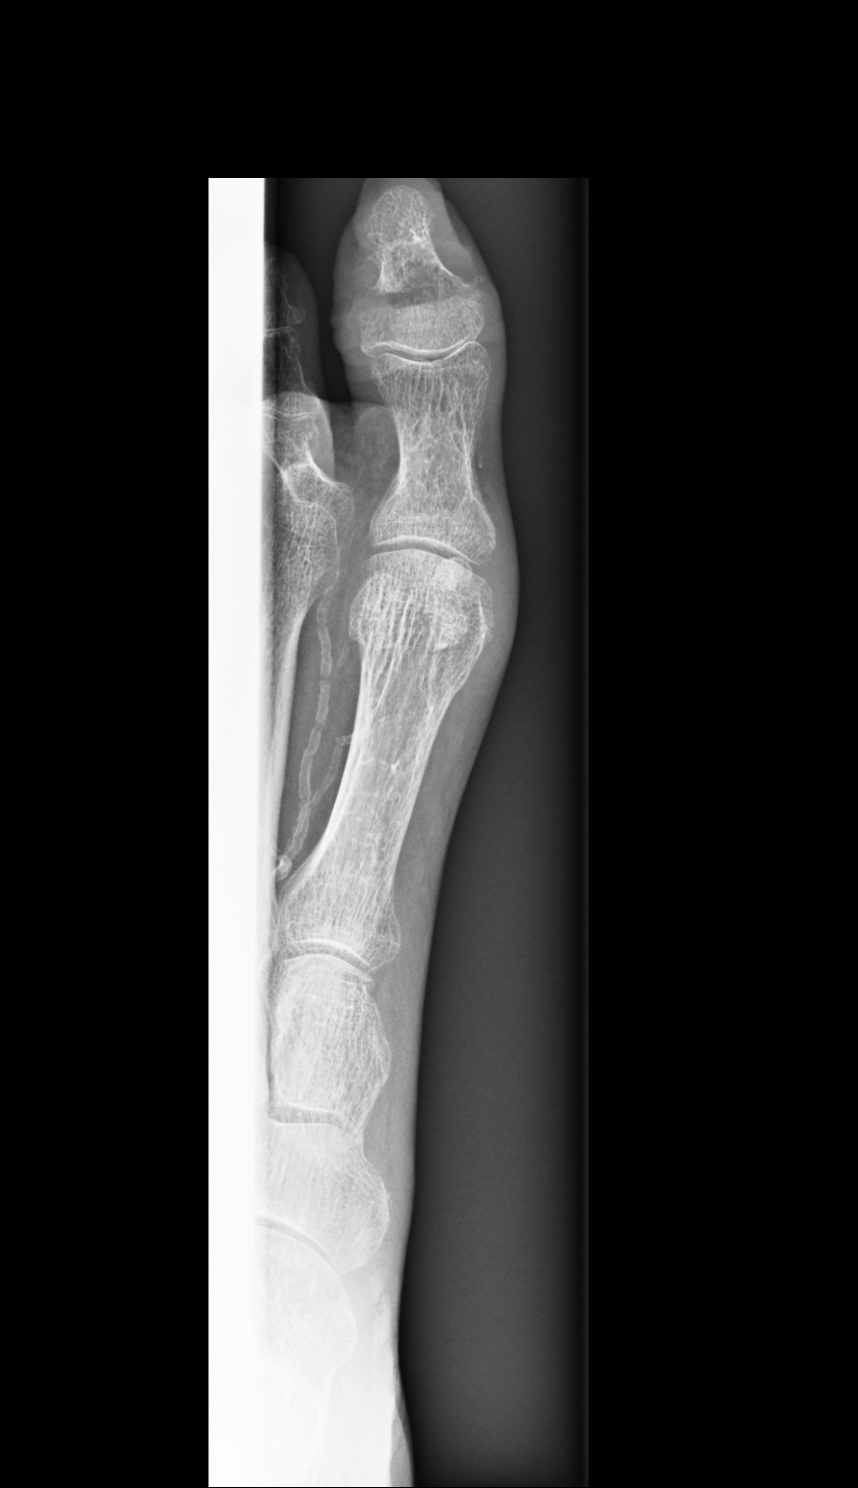
[im 2/3]
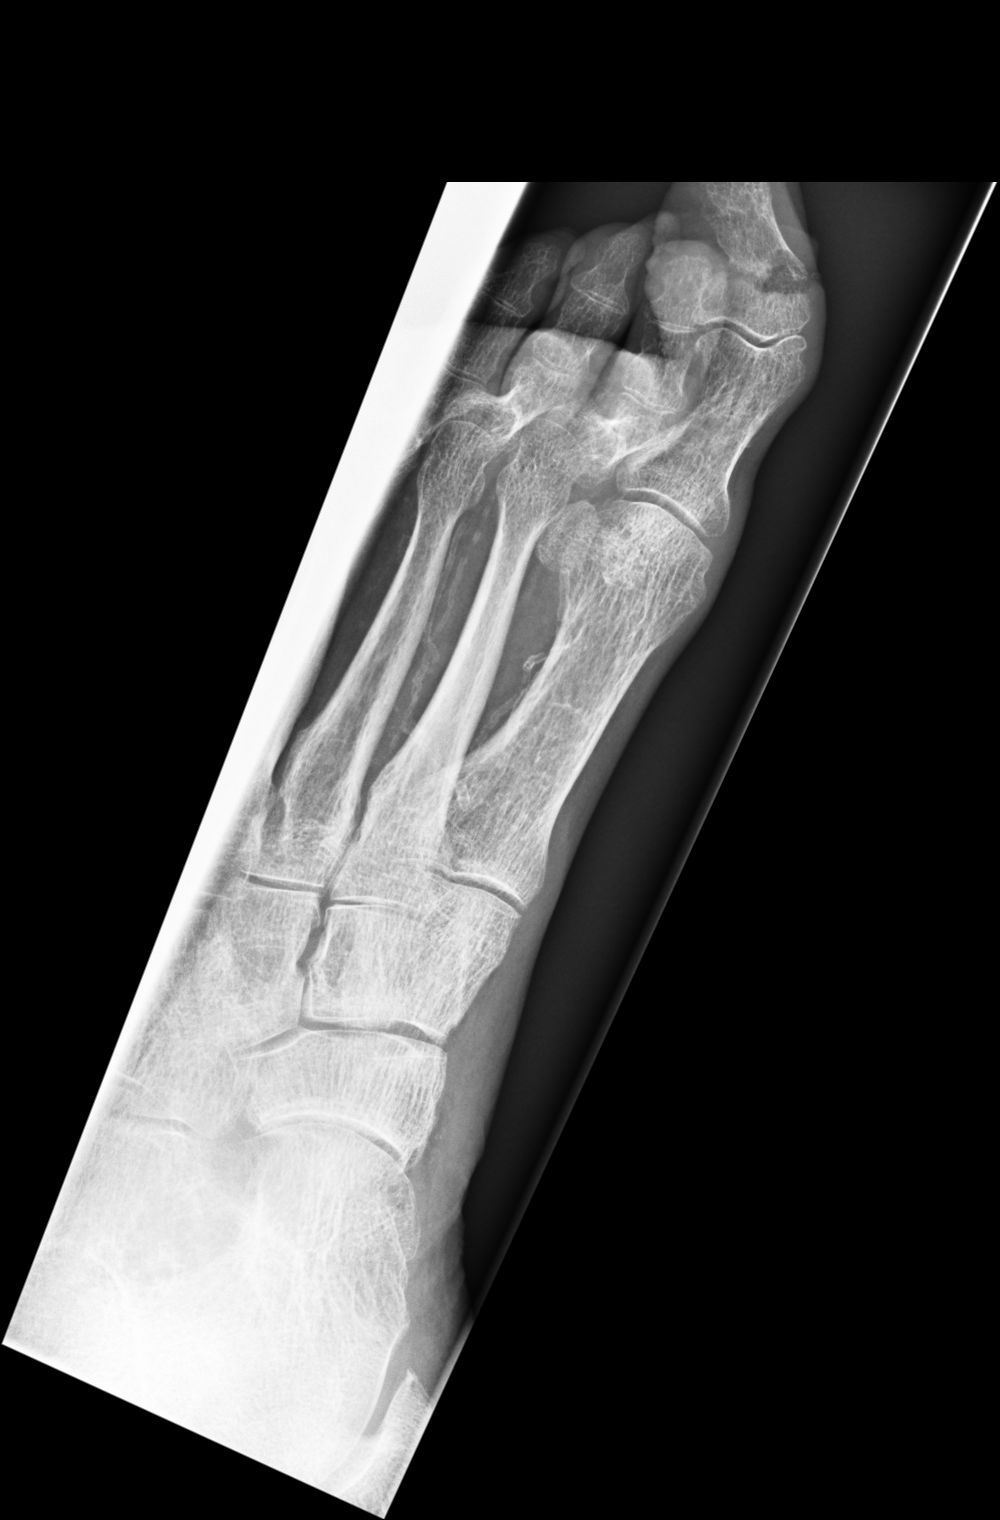
[im 3/3]
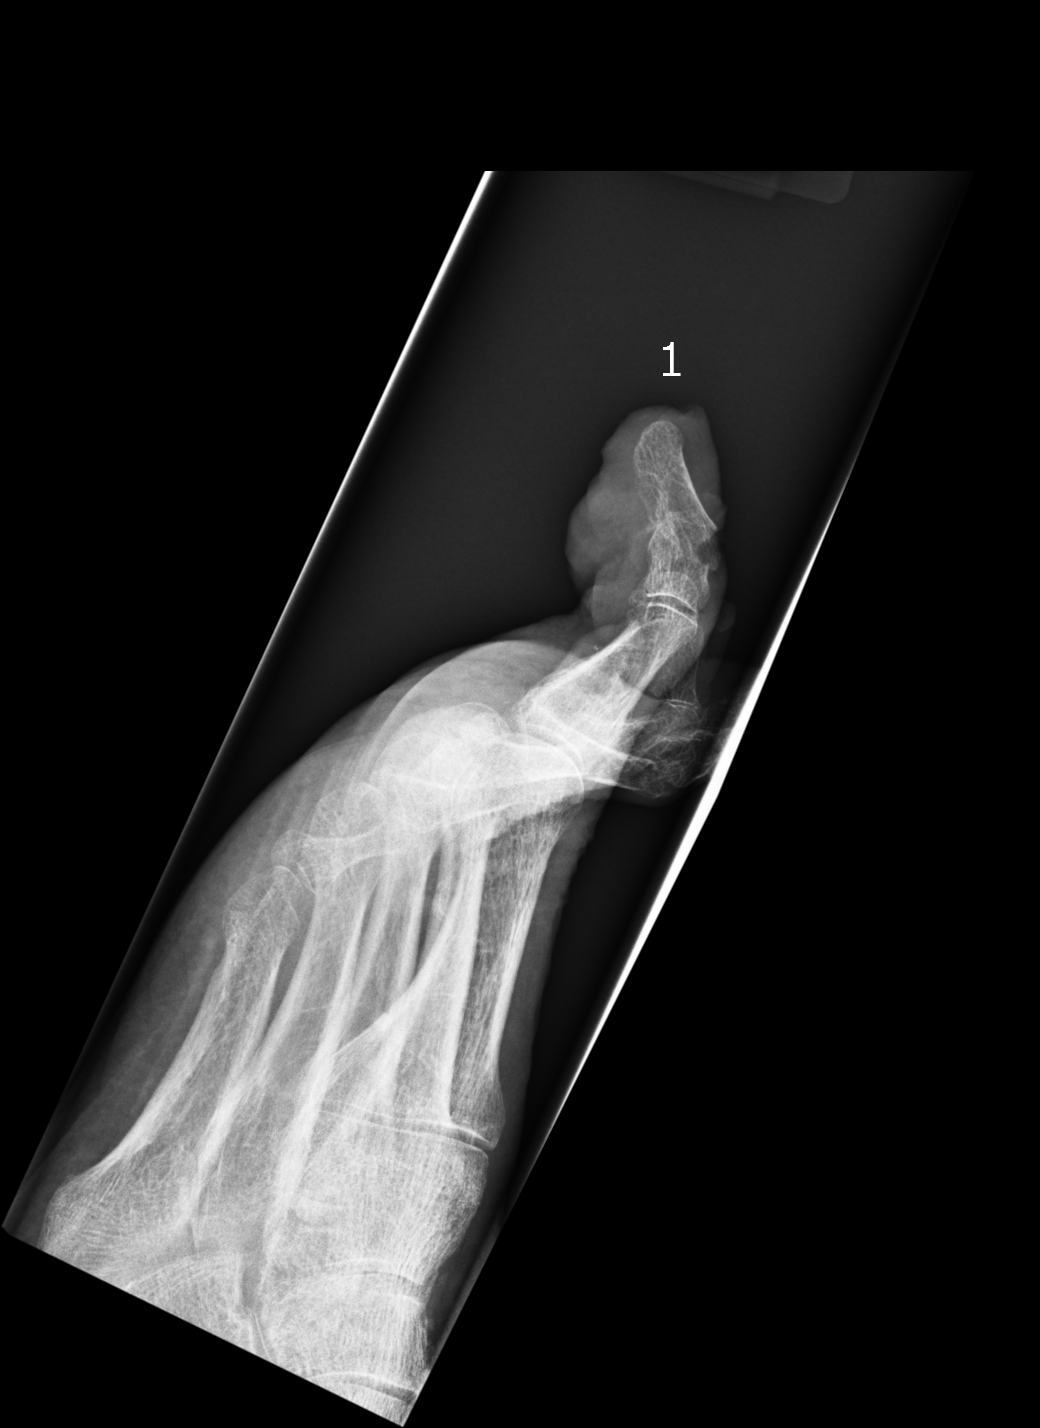

[3 of 3 positions shown; findings below may reference images not displayed]

FINDINGS: A transverse fracture is present through the distal phalanx in the
great toe. The interphalangeal joint is located. There is extensive
soft tissue swelling. Small vessel calcifications are present. There
is subluxation of the 2nd MTP joint, likely chronic.
IMPRESSION: 1. Transverse fracture of the distal phalanx with extensive soft
tissue swelling.
2. Microvascular calcifications compatible with diabetes.

## 2015-08-28 ENCOUNTER — Telehealth: Payer: Self-pay | Admitting: Internal Medicine

## 2015-08-28 NOTE — Telephone Encounter (Signed)
Patient Name: RALPHY KALICH  DOB: [birth year]    Initial Comment Caller states father's foot is swollen.   Nurse Assessment  Nurse: Mallie Mussel, RN, Alveta Heimlich Date/Time Eilene Ghazi Time): 08/28/2015 8:40:16 AM  Confirm and document reason for call. If symptomatic, describe symptoms. You must click the next button to save text entered. ---Caller states that her father's left foot has been swollen for a while now. The skin is beginning to turn red in color. Denies fever. The swelling is just in the foot, not the calf of the leg. He has been walking on the leg.  Has the patient traveled out of the country within the last 30 days? ---No  Does the patient have any new or worsening symptoms? ---Yes  Will a triage be completed? ---Yes  Related visit to physician within the last 2 weeks? ---No  Does the PT have any chronic conditions? (i.e. diabetes, asthma, etc.) ---No  Is this a behavioral health or substance abuse call? ---No     Guidelines    Guideline Title Affirmed Question Affirmed Notes  Leg Swelling and Edema [1] Red area or streak [2] large (> 2 in. or 5 cm)    Final Disposition User   See Physician within 4 Hours (or PCP triage) Mallie Mussel, RN, [place] states that her father only wants to see Dr. Silvio Pate, doesn't want to see any other doctor. I explained that Dr. Silvio Pate does not have any appointments for today. She asked when he could see Dr. Silvio Pate. Advised her that I will forward this information to the office and someone from the office will be calling her back. She verbalized understanding.   Referrals  GO TO FACILITY REFUSED   Disagree/Comply: Compl

## 2015-08-28 NOTE — Telephone Encounter (Signed)
I spoke with Arville Go and she scheduled appointment for 08/29/15 at 9:15.  She said they couldn't come today.

## 2015-08-28 NOTE — Telephone Encounter (Signed)
Spoke with [name] (DPR signed) for approx one week lt foot is swollen; now pt has redness on top of foot; no streaks of redness,does not feel warm to touch,no known injury; pt can walk on foot without pain and no fever or trouble breathing. Pt refuses to see any other provider except [name]. Pt last seen 03/28/12 and [name] said pt has not seen physician since saw [name]. Scheduled 30 min appt on 09/01/15 at 12 noon with [name] and [name] understands if condition changes or worsens prior to appt to take pt to UC or ED for eval. [name] voiced understanding.

## 2015-08-28 NOTE — Telephone Encounter (Signed)
Okay sounds good 

## 2015-08-28 NOTE — Telephone Encounter (Signed)
He does not need 30 minute visit Please offer 9:15 tomorrow I could probably squeeze him in at 2:15 today if no other choice. I don't think he should wait for Monday Please text me to let me know [name] can) if I am starting at 2:15 today

## 2015-08-29 ENCOUNTER — Encounter: Payer: Self-pay | Admitting: Internal Medicine

## 2015-08-29 ENCOUNTER — Ambulatory Visit (INDEPENDENT_AMBULATORY_CARE_PROVIDER_SITE_OTHER): Payer: Medicare Other | Admitting: Internal Medicine

## 2015-08-29 VITALS — BP 160/80 | Temp 98.1°F | Wt 125.0 lb

## 2015-08-29 DIAGNOSIS — I481 Persistent atrial fibrillation: Secondary | ICD-10-CM

## 2015-08-29 DIAGNOSIS — E43 Unspecified severe protein-calorie malnutrition: Secondary | ICD-10-CM

## 2015-08-29 DIAGNOSIS — I4819 Other persistent atrial fibrillation: Secondary | ICD-10-CM

## 2015-08-29 DIAGNOSIS — C7910 Secondary malignant neoplasm of unspecified urinary organs: Secondary | ICD-10-CM

## 2015-08-29 DIAGNOSIS — I96 Gangrene, not elsewhere classified: Secondary | ICD-10-CM

## 2015-08-29 NOTE — Assessment & Plan Note (Signed)
No symptoms Possible he had septic emboli to foot but no Rx contemplated at this point

## 2015-08-29 NOTE — Assessment & Plan Note (Signed)
The essential non medical person Discussed immediate hospitalization and amputation Given his wishes and weight loss/cancer--this is not likely to improve his longevity Extended discussion--will send home with hospice

## 2015-08-29 NOTE — Progress Notes (Signed)
   Subjective:    Patient ID: [name], male    DOB: 07/13/1921, 82 y.o.   MRN: [id number]  HPI Here with daughter Has blackness on a couple of toes on left   Has lost 25# recently Not eating much per daughter  No SOB No chest pain No palpitations No edema  No fever Feels he voids okay  Needs walker to walk Daughter occasionally helps him dress He does bathing and bathroom independently  No current outpatient prescriptions on file prior to visit.   No current facility-administered medications on file prior to visit.    No Known Allergies  Past Medical History  Diagnosis Date  . Hypertension   . Post herpetic neuralgia   . Atrial fibrillation ([place])   . Renal insufficiency   . Urothelial cancer ([place]) 11/12    Past Surgical History  Procedure Laterality Date  . Appendectomy      Family History  Problem Relation Age of Onset  . Coronary artery disease Neg Hx   . Diabetes Neg Hx   . Hypertension Neg Hx   . Cancer Neg Hx     Social History   Social History  . Marital Status: Widowed    Spouse Name: N/A  . Number of Children: 1  . Years of Education: N/A   Occupational History  . retired Chief Executive Officer    Social History Main Topics  . Smoking status: Former Smoker    Quit date: 08/03/1943  . Smokeless tobacco: Never Used  . Alcohol Use: Yes     Comment: occasional  . Drug Use: Not on file  . Sexual Activity: Not on file   Other Topics Concern  . Not on file   Social History Narrative   Review of Systems Sleeps okay No falls No depressed or sadness    Objective:   Physical Exam  Constitutional:  Appears gaunt  Neck: No thyromegaly present.  Cardiovascular: Normal rate.   irregular  Pulmonary/Chest: Effort normal. No respiratory distress.  Dullness both bases Bronchial sounds left base  Abdominal: Soft.  Musculoskeletal:  Gangrene to MTP in left 2nd-5th toes Rest of foot is red Right foot cold but looks okay  Lymphadenopathy:    He has  no cervical adenopathy.  Psychiatric: He has a normal mood and affect.          Assessment & Plan:

## 2015-08-29 NOTE — Assessment & Plan Note (Signed)
Probably related to cancer Will proceed with hospice

## 2015-08-29 NOTE — Assessment & Plan Note (Signed)
Goes back years Still seems to have at least pleural involvement Striking weight loss Doesn't want treatment

## 2015-08-29 NOTE — Progress Notes (Signed)
Pre visit review using our clinic review tool, if applicable. No additional management support is needed unless otherwise documented below in the visit note. 

## 2015-09-01 ENCOUNTER — Ambulatory Visit: Payer: Self-pay | Admitting: Internal Medicine

## 2015-09-04 ENCOUNTER — Telehealth: Payer: Self-pay | Admitting: Internal Medicine

## 2015-09-04 MED ORDER — MORPHINE SULFATE (CONCENTRATE) 20 MG/ML PO SOLN: 5.0000 mg | ORAL | Status: AC | PRN

## 2015-09-04 NOTE — Telephone Encounter (Signed)
Please fax the prescription. Let her know that I am tentatively planning a home visit next Wednesday afternoon around 2:30PM

## 2015-09-04 NOTE — Telephone Encounter (Signed)
Spoke with [facility] nurse and advised rx will be faxed in and also about the visit, she's not sure if he will last until next Wednesday, per nurse pt is not eating or drinking, he very confused and keeps falling. She has ordered some lorazepam to see if that will help.

## 2015-09-04 NOTE — Telephone Encounter (Signed)
She did say if he gets worse then they will move him and she also was concerned on how the daughter would handle that.

## 2015-09-04 NOTE — Telephone Encounter (Signed)
Stephanie from hospice called -  He is declining - not eating or drinking Requesting for liquid morphine in the home - he is yelling out in pain.  cb number [id number]

## 2015-09-04 NOTE — Addendum Note (Signed)
Addended by: Viviana Simpler I on: 09/04/2015 12:37 PM   Modules accepted: Orders

## 2015-09-04 NOTE — Telephone Encounter (Signed)
I am not surprised by that---will see how things go. Let her know that they should consider hospice home---I am not sure how well daughter will do as he declines

## 2015-09-06 DIAGNOSIS — R4182 Altered mental status, unspecified: Secondary | ICD-10-CM | POA: Diagnosis not present

## 2015-09-08 ENCOUNTER — Telehealth: Payer: Self-pay | Admitting: Internal Medicine

## 2015-09-08 NOTE — Telephone Encounter (Signed)
Colletta Maryland, Hospice nurse called.  Mr. Francy died on 09/26/2015 at 17:10 pm.

## 2015-10-01 DEATH — deceased
